# Patient Record
Sex: Female | Born: 1965
Health system: Southern US, Community
[De-identification: ages and names within clinical notes are randomized; demographics above are authoritative.]

## PROBLEM LIST (undated history)

## (undated) DIAGNOSIS — Z87898 Personal history of other specified conditions: Secondary | ICD-10-CM

## (undated) DIAGNOSIS — I10 Essential (primary) hypertension: Secondary | ICD-10-CM

## (undated) DIAGNOSIS — N882 Stricture and stenosis of cervix uteri: Secondary | ICD-10-CM

## (undated) DIAGNOSIS — J309 Allergic rhinitis, unspecified: Secondary | ICD-10-CM

## (undated) DIAGNOSIS — G43909 Migraine, unspecified, not intractable, without status migrainosus: Secondary | ICD-10-CM

## (undated) DIAGNOSIS — N950 Postmenopausal bleeding: Secondary | ICD-10-CM

## (undated) HISTORY — PX: TRANSPHENOIDAL PITUITARY RESECTION: SHX2572

## (undated) HISTORY — PX: DOBUTAMINE STRESS ECHO: SHX5426

---

## 1999-08-29 ENCOUNTER — Other Ambulatory Visit: Admission: RE | Admit: 1999-08-29 | Discharge: 1999-08-29 | Payer: Self-pay | Admitting: *Deleted

## 2002-07-19 ENCOUNTER — Other Ambulatory Visit: Admission: RE | Admit: 2002-07-19 | Discharge: 2002-07-19 | Payer: Self-pay | Admitting: *Deleted

## 2003-08-28 ENCOUNTER — Other Ambulatory Visit: Admission: RE | Admit: 2003-08-28 | Discharge: 2003-08-28 | Payer: Self-pay | Admitting: *Deleted

## 2004-09-12 ENCOUNTER — Other Ambulatory Visit: Admission: RE | Admit: 2004-09-12 | Discharge: 2004-09-12 | Payer: Self-pay | Admitting: Obstetrics and Gynecology

## 2005-11-19 ENCOUNTER — Other Ambulatory Visit: Admission: RE | Admit: 2005-11-19 | Discharge: 2005-11-19 | Payer: Self-pay | Admitting: Obstetrics and Gynecology

## 2008-05-12 ENCOUNTER — Ambulatory Visit: Payer: Self-pay | Admitting: Internal Medicine

## 2008-05-12 DIAGNOSIS — R0989 Other specified symptoms and signs involving the circulatory and respiratory systems: Secondary | ICD-10-CM

## 2008-05-12 DIAGNOSIS — R0609 Other forms of dyspnea: Secondary | ICD-10-CM | POA: Insufficient documentation

## 2008-05-12 DIAGNOSIS — R069 Unspecified abnormalities of breathing: Secondary | ICD-10-CM | POA: Insufficient documentation

## 2008-05-12 HISTORY — DX: Other forms of dyspnea: R06.09

## 2008-05-12 HISTORY — DX: Unspecified abnormalities of breathing: R06.9

## 2008-05-12 HISTORY — DX: Other specified symptoms and signs involving the circulatory and respiratory systems: R09.89

## 2008-05-16 ENCOUNTER — Telehealth (INDEPENDENT_AMBULATORY_CARE_PROVIDER_SITE_OTHER): Payer: Self-pay | Admitting: *Deleted

## 2008-05-16 DIAGNOSIS — E038 Other specified hypothyroidism: Secondary | ICD-10-CM

## 2008-05-16 DIAGNOSIS — E0590 Thyrotoxicosis, unspecified without thyrotoxic crisis or storm: Secondary | ICD-10-CM | POA: Insufficient documentation

## 2008-05-16 HISTORY — DX: Thyrotoxicosis, unspecified without thyrotoxic crisis or storm: E05.90

## 2008-05-16 HISTORY — DX: Other specified hypothyroidism: E03.8

## 2008-06-07 ENCOUNTER — Ambulatory Visit: Payer: Self-pay | Admitting: Endocrinology

## 2008-06-07 LAB — CONVERTED CEMR LAB
Free T4: 0.7 ng/dL (ref 0.6–1.6)
TSH: 0.07 microintl units/mL — ABNORMAL LOW (ref 0.35–5.50)

## 2008-06-26 ENCOUNTER — Encounter (HOSPITAL_COMMUNITY): Admission: RE | Admit: 2008-06-26 | Discharge: 2008-07-20 | Payer: Self-pay | Admitting: Endocrinology

## 2010-11-25 ENCOUNTER — Encounter: Payer: Self-pay | Admitting: Endocrinology

## 2010-12-01 LAB — CONVERTED CEMR LAB
ALT: 15 units/L (ref 0–35)
AST: 19 units/L (ref 0–37)
Albumin: 4 g/dL (ref 3.5–5.2)
Alkaline Phosphatase: 66 units/L (ref 39–117)
BUN: 8 mg/dL (ref 6–23)
Basophils Absolute: 0 10*3/uL (ref 0.0–0.1)
Basophils Relative: 0.4 % (ref 0.0–1.0)
Bilirubin, Direct: 0.1 mg/dL (ref 0.0–0.3)
CO2: 30 meq/L (ref 19–32)
Calcium: 9.3 mg/dL (ref 8.4–10.5)
Chloride: 106 meq/L (ref 96–112)
Creatinine, Ser: 0.7 mg/dL (ref 0.4–1.2)
Eosinophils Absolute: 0.1 10*3/uL (ref 0.0–0.7)
Eosinophils Relative: 1.3 % (ref 0.0–5.0)
GFR calc Af Amer: 117 mL/min
GFR calc non Af Amer: 97 mL/min
Glucose, Bld: 96 mg/dL (ref 70–99)
HCT: 40.7 % (ref 36.0–46.0)
Hemoglobin: 14 g/dL (ref 12.0–15.0)
Lymphocytes Relative: 35 % (ref 12.0–46.0)
MCHC: 34.4 g/dL (ref 30.0–36.0)
MCV: 92 fL (ref 78.0–100.0)
Monocytes Absolute: 0.3 10*3/uL (ref 0.1–1.0)
Monocytes Relative: 6.8 % (ref 3.0–12.0)
Neutro Abs: 2.7 10*3/uL (ref 1.4–7.7)
Neutrophils Relative %: 56.5 % (ref 43.0–77.0)
Platelets: 251 10*3/uL (ref 150–400)
Potassium: 4.8 meq/L (ref 3.5–5.1)
Pro B Natriuretic peptide (BNP): 9 pg/mL (ref 0.0–100.0)
Prolactin: 10.2 ng/mL
RBC: 4.42 M/uL (ref 3.87–5.11)
RDW: 12.4 % (ref 11.5–14.6)
Sodium: 140 meq/L (ref 135–145)
TSH: 0.16 microintl units/mL — ABNORMAL LOW (ref 0.35–5.50)
Total Bilirubin: 0.6 mg/dL (ref 0.3–1.2)
Total Protein: 6.9 g/dL (ref 6.0–8.3)
WBC: 4.8 10*3/uL (ref 4.5–10.5)

## 2010-12-03 NOTE — Progress Notes (Signed)
Summary: return call  Phone Note Call from Patient Call back at [phone]   Caller: Patient Call For: wert Summary of Call: returning call to leslie...please call her on cell phone [phone] Initial call taken by: Eugene Gavia,  May 16, 2008 9:11 AM  Follow-up for Phone Call        Done, see addendum from labs. Follow-up by: Vernie Murders,  May 16, 2008 2:39 PM  New Problems: HYPERTHYROIDISM (ICD-242.90)   New Problems: HYPERTHYROIDISM (ICD-242.90)

## 2010-12-03 NOTE — Assessment & Plan Note (Signed)
Summary: SOB/APC   Visit Type:  Initial Consult Referred by:  Dr. Richardean Chimera PCP:  Dr. Richardean Chimera  Chief Complaint:  SOB.  History of Present Illness: 45 yobf  never smoker with h/o mild seasonal rhinitis controlled with otc never sob with indolent onset winter 09  doe x carrying clothes across parking lot plus talking on phone voice no more raspy than usual (always this way) no noct symptoms stayed about the same since onset.  No cough, itching sneezing or wheezing.  Seems to correlate more with use of voice than exercise.   For example, she always gets sob at top of steps in her three story townhouse where she's lived for the last 4 years, no worse doe now than prior to onset of these most recent symptoms in Winter 09, unless she's talking on the phone when she goes up the steps,  Pt denies any significant sore throat, nasal congestion or excess secretions, fever, chills, sweats, unintended wt loss, pleuritic or exertional cp, orthopnea pnd or leg swelling.  Pt also denies any obvious fluctuation in symptoms with weather or environmental change or other alleviating or aggravating factors.    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   Family History:    no resp or heart dz    no thyroid problems  Social History:    Never smoker    Separated, has children    Works in Air[employer] pilot    Review of Systems  The patient denies anorexia, fever, weight loss, weight gain, vision loss, decreased hearing, hoarseness, chest pain, syncope, peripheral edema, prolonged cough, headaches, hemoptysis, abdominal pain, melena, hematochezia, severe indigestion/heartburn, hematuria, incontinence, muscle weakness, suspicious  skin lesions, transient blindness, difficulty walking, depression, unusual weight change, abnormal bleeding, enlarged lymph nodes, and angioedema.     Vital Signs:  Patient Profile:   45 Years Old Female Weight:      131.25 pounds O2 Sat:      100 % O2 treatment:    Room Air Temp:     98.2 degrees F oral Pulse rate:   72 / minute BP sitting:   124 / 74  (left arm)  Vitals Entered By: Vernie Murders (May 12, 2008 10:07 AM)                 Physical Exam    Ambulatory healthy appearing bf  in no acute distress with classic voice fatigue Afeb with normal vital signs HEENT: nl dentition, turbinates, and orophanx. Nl external ear canals without cough reflex, no lid lag Neck without JVD/Nodes/TM or thyroid bruit Lungs clear to A and P bilaterally without cough on insp or exp maneuvers RRR no s3 or murmur or increase in P2.  Resting pulse 72, at top of 3 flights 165 Abd soft and benign with nl excursion in the supine position. 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  will go ahead and initiate Lopressor at a dose of 25 mg b.i.d. and have her seen by our endocrinologist as soon as possible for a full workup.  Also will empirically treat her for occult acid reflux disease with diet and PPI short-term.    Medications Added to Medication List  This Visit: 1)  Nexium 40 Mg Cpdr (Esomeprazole magnesium) .... By mouth daily. take one half hour before eating.  Other Orders: Misc. Referral (Misc. Ref)   Patient Instructions: 1)  GERD (gastroesophageal reflux disease) was discussed. It is a common cause of respiratory symptoms. It commonly presents in the absence of heartburn. GERD can be treated with medication, but also with lifestyle changes including avoidance of late meals, excessive alcohol, smoking cessation, and avoid fatty foods, chocolate, peppermint, colas, red wine, and acidic juices such as orange juice.  2)  NO MINT OR MENTHOL PRODUCTS 3)  USE SUGARLESS CANDY INSTEAD (jolley ranchers)  4)  minimize coffee intake 5)  Please schedule a follow-up appointment in 2 weeks   ]

## 2010-12-03 NOTE — Assessment & Plan Note (Signed)
Summary: NEW ENDO CONSULT/DR WERT/HYPERTHYROID/[employer] $50 /[place]   Vital Signs:  Patient Profile:   45 Years Old Female Weight:      133.0 pounds Temp:     97.2 degrees F oral Pulse rate:   70 / minute BP sitting:   122 / 78  (left arm) Cuff size:   regular  Pt. in pain?   no  Vitals Entered By: Orlan Leavens (June 07, 2008 9:58 AM)                  Referred by:  Dr. Richardean Chimera PCP:  Dr. Richardean Chimera  Chief Complaint:  New Endo/ Hyperthyroidism Also pt concern about the metoprolol Md started her on since started been sleeping alot.  History of Present Illness: pt was recently seen for sob, and was noted to have doe, and assoc palpitations in the chest.  also has slight anxiety.  was rx'ed lopressor.  since on it, palpitations are better, she has severe fatigue    Current Allergies: No known allergies   Past Medical History:    Reviewed history from 05/12/2008 and no changes required:       HYPERTHYROIDISM (ICD-242.90)       DYSPNEA (ICD-786.09)          Family History:    Reviewed history from 05/12/2008 and no changes required:       no resp or heart dz       no thyroid problems  Social History:    Reviewed history from 05/12/2008 and no changes required:       Never smoker       Separated, has children       Works in Airline pilot    Review of Systems  The patient denies syncope.         denies weight loss, eye pain, headache, cough, diarrhea, myalgias, excessive diaphoresis, tremor.  pt has slight rhinorrhea and easy bruising   Physical Exam  General:     well developed, well nourished, in no acute distress Head:     head is normocephalic eyes: no scleral icterus no periorbital swelling perrl external ears are normal nose normal externally mouth has no lesion, including normal tongue  Eyes:     no proptosis Neck:     i do not appreciate a goiter Lungs:     clear to auscultation.  no respiratory distress  Heart:     regular rate and rhythm, S1,  S2 without murmurs, rubs, gallops, or clicks Msk:     no deformity or scoliosis noted with normal posture and gait Neurologic:     cn 2-12 grossly intact.   readily moves all 4's.   no tremor Skin:     normal texture and temp.  no rash.  not diaphoretic  Cervical Nodes:     no significant adenopathy Psych:     alert and cooperative; normal mood and affect; normal attention span and concentration Additional Exam:      FREE T4                   0.7 ng/dL                   3.2-4.4 FastTSH              [L]  0.07 uIU/mL       Impression & Recommendations:  Problem # 1:  HYPERTHYROIDISM (ICD-242.90)  Problem # 2:  fatigue could be due to #1, or  to lopressor  Problem # 3:  palpitations due to #1, improved on lopressor  Medications Added to Medication List This Visit: 1)  Metoprolol Tartrate 25 Mg Tabs (Metoprolol tartrate) .... 1/2 bid   Patient Instructions: 1)  cc dr Arelia Sneddon 2)  scan 3)  reduce lopressor to 12.5 bid   Prescriptions: METOPROLOL TARTRATE 25 MG  TABS (METOPROLOL TARTRATE) 1/2 bid  #30 x 2   Entered and Authorized by:   Minus Breeding MD   Signed by:   Minus Breeding MD on 06/07/2008   Method used:   Electronically sent to ...       [facility] [phone]*       [address]       [place], [place]  [address]       Ph: [phone]       Fax: [phone]   RxID:   [id number]  ]

## 2011-09-09 ENCOUNTER — Other Ambulatory Visit: Payer: Self-pay | Admitting: Family Medicine

## 2011-09-09 DIAGNOSIS — Z1231 Encounter for screening mammogram for malignant neoplasm of breast: Secondary | ICD-10-CM

## 2011-10-01 ENCOUNTER — Ambulatory Visit
Admission: RE | Admit: 2011-10-01 | Discharge: 2011-10-01 | Disposition: A | Discharge status: Home / Self Care | Payer: BC Managed Care – PPO | Source: Ambulatory Visit | Attending: Family Medicine | Admitting: Family Medicine

## 2011-10-01 DIAGNOSIS — Z1231 Encounter for screening mammogram for malignant neoplasm of breast: Secondary | ICD-10-CM

## 2012-09-22 ENCOUNTER — Other Ambulatory Visit: Payer: Self-pay | Admitting: Family Medicine

## 2012-09-22 DIAGNOSIS — Z1231 Encounter for screening mammogram for malignant neoplasm of breast: Secondary | ICD-10-CM

## 2012-10-18 ENCOUNTER — Ambulatory Visit
Admission: RE | Admit: 2012-10-18 | Discharge: 2012-10-18 | Disposition: A | Discharge status: Home / Self Care | Payer: BC Managed Care – PPO | Source: Ambulatory Visit | Attending: Family Medicine | Admitting: Family Medicine

## 2012-10-18 DIAGNOSIS — Z1231 Encounter for screening mammogram for malignant neoplasm of breast: Secondary | ICD-10-CM

## 2013-10-19 ENCOUNTER — Other Ambulatory Visit: Payer: Self-pay

## 2013-10-19 DIAGNOSIS — Z1231 Encounter for screening mammogram for malignant neoplasm of breast: Secondary | ICD-10-CM

## 2013-10-31 ENCOUNTER — Ambulatory Visit
Admission: RE | Admit: 2013-10-31 | Discharge: 2013-10-31 | Disposition: A | Discharge status: Home / Self Care | Payer: BC Managed Care – PPO | Source: Ambulatory Visit

## 2013-10-31 DIAGNOSIS — Z1231 Encounter for screening mammogram for malignant neoplasm of breast: Secondary | ICD-10-CM

## 2014-09-07 DIAGNOSIS — D352 Benign neoplasm of pituitary gland: Secondary | ICD-10-CM

## 2014-09-07 DIAGNOSIS — G4489 Other headache syndrome: Secondary | ICD-10-CM

## 2014-09-07 HISTORY — DX: Benign neoplasm of pituitary gland: D35.2

## 2014-09-07 HISTORY — DX: Other headache syndrome: G44.89

## 2014-09-20 DIAGNOSIS — K581 Irritable bowel syndrome with constipation: Secondary | ICD-10-CM

## 2014-09-20 DIAGNOSIS — K589 Irritable bowel syndrome without diarrhea: Secondary | ICD-10-CM | POA: Insufficient documentation

## 2014-09-20 DIAGNOSIS — R87619 Unspecified abnormal cytological findings in specimens from cervix uteri: Secondary | ICD-10-CM | POA: Insufficient documentation

## 2014-09-20 DIAGNOSIS — IMO0002 Reserved for concepts with insufficient information to code with codable children: Secondary | ICD-10-CM | POA: Insufficient documentation

## 2014-09-20 DIAGNOSIS — N951 Menopausal and female climacteric states: Secondary | ICD-10-CM | POA: Insufficient documentation

## 2014-09-20 DIAGNOSIS — M79676 Pain in unspecified toe(s): Secondary | ICD-10-CM

## 2014-09-20 DIAGNOSIS — F4320 Adjustment disorder, unspecified: Secondary | ICD-10-CM | POA: Insufficient documentation

## 2014-09-20 DIAGNOSIS — S92532A Displaced fracture of distal phalanx of left lesser toe(s), initial encounter for closed fracture: Secondary | ICD-10-CM

## 2014-09-20 DIAGNOSIS — F419 Anxiety disorder, unspecified: Secondary | ICD-10-CM | POA: Insufficient documentation

## 2014-09-20 HISTORY — DX: Anxiety disorder, unspecified: F41.9

## 2014-09-20 HISTORY — DX: Adjustment disorder, unspecified: F43.20

## 2014-09-20 HISTORY — DX: Displaced fracture of distal phalanx of left lesser toe(s), initial encounter for closed fracture: S92.532A

## 2014-09-20 HISTORY — DX: Irritable bowel syndrome with constipation: K58.1

## 2014-09-20 HISTORY — DX: Pain in unspecified toe(s): M79.676

## 2014-09-20 HISTORY — DX: Reserved for concepts with insufficient information to code with codable children: IMO0002

## 2014-09-20 HISTORY — DX: Menopausal and female climacteric states: N95.1

## 2014-09-20 HISTORY — DX: Irritable bowel syndrome without diarrhea: K58.9

## 2014-10-11 ENCOUNTER — Other Ambulatory Visit: Payer: Self-pay

## 2014-10-11 DIAGNOSIS — Z1231 Encounter for screening mammogram for malignant neoplasm of breast: Secondary | ICD-10-CM

## 2014-11-01 ENCOUNTER — Encounter (INDEPENDENT_AMBULATORY_CARE_PROVIDER_SITE_OTHER): Payer: Self-pay

## 2014-11-01 ENCOUNTER — Ambulatory Visit
Admission: RE | Admit: 2014-11-01 | Discharge: 2014-11-01 | Disposition: A | Discharge status: Home / Self Care | Payer: BC Managed Care – PPO | Source: Ambulatory Visit

## 2014-11-01 DIAGNOSIS — Z1231 Encounter for screening mammogram for malignant neoplasm of breast: Secondary | ICD-10-CM

## 2015-10-24 ENCOUNTER — Other Ambulatory Visit: Payer: Self-pay

## 2015-10-24 DIAGNOSIS — Z1231 Encounter for screening mammogram for malignant neoplasm of breast: Secondary | ICD-10-CM

## 2015-11-21 ENCOUNTER — Ambulatory Visit
Admission: RE | Admit: 2015-11-21 | Discharge: 2015-11-21 | Disposition: A | Discharge status: Home / Self Care | Payer: BLUE CROSS/BLUE SHIELD | Source: Ambulatory Visit

## 2015-11-21 DIAGNOSIS — Z1231 Encounter for screening mammogram for malignant neoplasm of breast: Secondary | ICD-10-CM

## 2016-11-03 HISTORY — PX: COLONOSCOPY WITH PROPOFOL: SHX5780

## 2016-12-25 ENCOUNTER — Other Ambulatory Visit: Payer: Self-pay | Admitting: Family Medicine

## 2016-12-25 DIAGNOSIS — Z1231 Encounter for screening mammogram for malignant neoplasm of breast: Secondary | ICD-10-CM

## 2016-12-30 DIAGNOSIS — J4520 Mild intermittent asthma, uncomplicated: Secondary | ICD-10-CM | POA: Insufficient documentation

## 2016-12-30 DIAGNOSIS — J4521 Mild intermittent asthma with (acute) exacerbation: Secondary | ICD-10-CM | POA: Insufficient documentation

## 2016-12-30 DIAGNOSIS — J208 Acute bronchitis due to other specified organisms: Secondary | ICD-10-CM

## 2016-12-30 HISTORY — DX: Mild intermittent asthma with (acute) exacerbation: J45.21

## 2016-12-30 HISTORY — DX: Acute bronchitis due to other specified organisms: J20.8

## 2017-01-14 ENCOUNTER — Ambulatory Visit
Admission: RE | Admit: 2017-01-14 | Discharge: 2017-01-14 | Disposition: A | Discharge status: Home / Self Care | Payer: Managed Care, Other (non HMO) | Source: Ambulatory Visit | Attending: Family Medicine | Admitting: Family Medicine

## 2017-01-14 DIAGNOSIS — Z1231 Encounter for screening mammogram for malignant neoplasm of breast: Secondary | ICD-10-CM

## 2017-03-23 DIAGNOSIS — E663 Overweight: Secondary | ICD-10-CM | POA: Insufficient documentation

## 2017-03-23 DIAGNOSIS — I10 Essential (primary) hypertension: Secondary | ICD-10-CM | POA: Insufficient documentation

## 2017-03-23 DIAGNOSIS — G2579 Other drug induced movement disorders: Secondary | ICD-10-CM

## 2017-03-23 DIAGNOSIS — G9081 Serotonin syndrome: Secondary | ICD-10-CM

## 2017-03-23 HISTORY — DX: Essential (primary) hypertension: I10

## 2017-03-23 HISTORY — DX: Other drug induced movement disorders: G25.79

## 2017-03-23 HISTORY — DX: Overweight: E66.3

## 2017-03-23 HISTORY — DX: Serotonin syndrome: G90.81

## 2017-04-22 DIAGNOSIS — Z1211 Encounter for screening for malignant neoplasm of colon: Secondary | ICD-10-CM | POA: Insufficient documentation

## 2017-04-22 HISTORY — DX: Encounter for screening for malignant neoplasm of colon: Z12.11

## 2017-05-14 LAB — HM COLONOSCOPY

## 2017-09-15 DIAGNOSIS — R600 Localized edema: Secondary | ICD-10-CM | POA: Insufficient documentation

## 2017-09-15 HISTORY — DX: Localized edema: R60.0

## 2017-09-30 DIAGNOSIS — T783XXA Angioneurotic edema, initial encounter: Secondary | ICD-10-CM

## 2017-09-30 HISTORY — DX: Angioneurotic edema, initial encounter: T78.3XXA

## 2017-10-12 ENCOUNTER — Encounter (HOSPITAL_BASED_OUTPATIENT_CLINIC_OR_DEPARTMENT_OTHER): Payer: Self-pay | Admitting: Emergency Medicine

## 2017-10-12 ENCOUNTER — Emergency Department (HOSPITAL_BASED_OUTPATIENT_CLINIC_OR_DEPARTMENT_OTHER): Payer: Managed Care, Other (non HMO)

## 2017-10-12 ENCOUNTER — Other Ambulatory Visit: Payer: Self-pay

## 2017-10-12 ENCOUNTER — Emergency Department (HOSPITAL_BASED_OUTPATIENT_CLINIC_OR_DEPARTMENT_OTHER)
Admission: EM | Admit: 2017-10-12 | Discharge: 2017-10-12 | Disposition: A | Discharge status: Home / Self Care | Payer: Managed Care, Other (non HMO) | Attending: Emergency Medicine | Admitting: Emergency Medicine

## 2017-10-12 DIAGNOSIS — R1013 Epigastric pain: Secondary | ICD-10-CM | POA: Diagnosis not present

## 2017-10-12 DIAGNOSIS — R0602 Shortness of breath: Secondary | ICD-10-CM | POA: Diagnosis not present

## 2017-10-12 DIAGNOSIS — Z79899 Other long term (current) drug therapy: Secondary | ICD-10-CM | POA: Diagnosis not present

## 2017-10-12 DIAGNOSIS — R5381 Other malaise: Secondary | ICD-10-CM | POA: Insufficient documentation

## 2017-10-12 DIAGNOSIS — I10 Essential (primary) hypertension: Secondary | ICD-10-CM | POA: Diagnosis not present

## 2017-10-12 DIAGNOSIS — R1010 Upper abdominal pain, unspecified: Secondary | ICD-10-CM | POA: Diagnosis not present

## 2017-10-12 DIAGNOSIS — R51 Headache: Secondary | ICD-10-CM | POA: Diagnosis present

## 2017-10-12 HISTORY — DX: Essential (primary) hypertension: I10

## 2017-10-12 LAB — TROPONIN I: Troponin I: <0.03 ng/mL (ref <0.03)

## 2017-10-12 MED ORDER — DIPHENHYDRAMINE HCL 50 MG/ML IJ SOLN: 25.0000 mg | Freq: Once | INTRAMUSCULAR | Status: DC

## 2017-10-12 MED ORDER — DEXAMETHASONE SODIUM PHOSPHATE 10 MG/ML IJ SOLN: 10.0000 mg | Freq: Once | INTRAMUSCULAR | Status: DC

## 2017-10-12 MED ORDER — METOCLOPRAMIDE HCL 5 MG/ML IJ SOLN: 10.0000 mg | Freq: Once | INTRAMUSCULAR | Status: DC

## 2017-10-12 NOTE — Discharge Instructions (Signed)
Try doing 1 cap full of MiraLAX in 8 ounces of water daily for the next week and continue the Zantac for the next 2 weeks.  If your stomach discomfort does not improve at that time you will need to follow-up with your regular doctor or your gastroenterologist who did your colonoscopy for further evaluation.  You would need to seek evaluation sooner if you develop severe pain, fever, vomiting or excessive diarrhea.

## 2017-10-12 NOTE — ED Triage Notes (Addendum)
Patient reports recently diagnosed with HTN.  Reports allergic reaction to 2 medications. States changed medications last week and states that BP remains unchanged after taking medication.  Reports headache and shortness of breath. Denies CP at present

## 2017-10-12 NOTE — ED Notes (Signed)
ED Provider at bedside discussing test results and dispo plan of care. 

## 2017-10-12 NOTE — ED Notes (Signed)
Patient transported to X-ray 

## 2017-10-12 NOTE — ED Provider Notes (Signed)
[facility] DEPART[facility] Provider Note   CSN: [id number] Arrival date & time: 10/12/17  1537     History   Chief Complaint Chief Complaint  Patient presents with  . Hypertension  . Headache    HPI Gloria Bass is a 51 y.o. female.  Patient is a 51 year old female with a history of hypertension presenting today with headache, upper abdominal pain, general malaise and some slight shortness of breath.  Patient states that since May she was diagnosed with hypertension and she has gone through 6 different blood pressure medications due to intolerance and allergy.  Patient was most recently switched to clonidine and terazosin 2 weeks ago and has been taking those but she is concerned they are not working.  She was asymptomatic last week when she started taking the medication however on Saturday 3 days prior to arrival she started not feeling well.  She states she had a headache, soreness of her eyes, upper abdominal fullness, pain and some slight shortness of breath.  She then decided to check her blood pressure and at that time it was 160/109.  She checked her blood pressure multiple other times and it remained in that range so she spoke with her doctor who increased the clonidine 0.2 mg twice a day which the patient has been doing for the last 2 days but her symptoms have not improved.  She states since Saturday she has had persistent epigastric discomfort, bloating which is worse with eating and this headache.  She denies fever, cough or congestion.  She states she feels slightly winded but no leg swelling or pain.  She has no prior history of heart disease or blood clots in herself or her family members.  She has not had any chest pain.  Symptoms are not worse with lying flat.  Did start taking Zantac yesterday without any change in her symptoms.  Patient states she has had symptoms with her abdomen like this intermittently for the last few weeks to months but had never been  constant until Saturday.   The history is provided by the patient.  Headache      Past Medical History:  Diagnosis Date  . Hypertension     Patient Active Problem List   Diagnosis Date Noted  . HYPERTHYROIDISM 05/16/2008  . DYSPNEA 05/12/2008    History reviewed. No pertinent surgical history.  OB History    No data available       Home Medications    Prior to Admission medications   Medication Sig Start Date End Date Taking? Authorizing Provider  Cholecalciferol (VITAMIN D PO) Take by mouth.   Yes [provider]  cloNIDine (CATAPRES) 0.1 MG tablet Take 0.2 mg by mouth 2 (two) times daily.    Yes [provider]  terazosin (HYTRIN) 1 MG capsule Take 1 mg by mouth at bedtime.   Yes [provider]    Family History History reviewed. No pertinent family history.  Social History Social History   Tobacco Use  . Smoking status: Never Smoker  . Smokeless tobacco: Never Used  Substance Use Topics  . Alcohol use: Yes  . Drug use: No     Allergies   Hydrochlorothiazide and Lisinopril   Review of Systems Review of Systems  All other systems reviewed and are negative.    Physical Exam Updated Vital Signs BP (!) 142/93 (BP Location: Right Arm)   Pulse (!) 50   Temp 98 F (36.7 C) (Oral)   Resp 16  Ht 5\' 2"  (1.575 m)   Wt 65.8 kg (145 lb)   SpO2 100%   BMI 26.52 kg/m   Physical Exam  Constitutional: She is oriented to person, place, and time. She appears well-developed and well-nourished. No distress.  HENT:  Head: Normocephalic and atraumatic.  Mouth/Throat: Oropharynx is clear and moist.  Eyes: Conjunctivae and EOM are normal. Pupils are equal, round, and reactive to light.  Neck: Normal range of motion. Neck supple.  Cardiovascular: Normal rate, regular rhythm and intact distal pulses.  No murmur heard. Pulmonary/Chest: Effort normal and breath sounds normal. No respiratory distress. She has no wheezes. She has no  rales.  Abdominal: Soft. She exhibits distension. There is tenderness in the epigastric area. There is no rebound and no guarding.    Musculoskeletal: Normal range of motion. She exhibits no edema or tenderness.  Neurological: She is alert and oriented to person, place, and time.  Skin: Skin is warm and dry. No rash noted. No erythema.  Psychiatric: She has a normal mood and affect. Her behavior is normal.  Nursing note and vitals reviewed.    ED Treatments / Results  Labs (all labs ordered are listed, but only abnormal results are displayed) Labs Reviewed  TROPONIN I    EKG  EKG Interpretation  Date/Time:  Monday October 12 2017 17:05:06 EST Ventricular Rate:  54 PR Interval:    QRS Duration: 96 QT Interval:  441 QTC Calculation: 418 R Axis:   49 Text Interpretation:  Sinus rhythm Normal ECG No previous tracing Confirmed by Blanchie Dessert [phone]) on 10/12/2017 5:13:09 PM       Radiology Dg Abd Acute W/chest  Result Date: 10/12/2017 CLINICAL DATA:  Patient with shortness of breath with exertion. Headache. EXAM: DG ABDOMEN ACUTE W/ 1V CHEST COMPARISON:  Chest radiograph 05/12/2008 FINDINGS: Monitoring leads overlie the patient. Normal cardiac and mediastinal contours. Contour density along the superior right mediastinum likely vascular in etiology. No consolidative pulmonary opacities. No pleural effusion or pneumothorax. Paucity of small bowel gas. Stool throughout the colon. No evidence for free intraperitoneal air. Unremarkable osseous skeleton. IMPRESSION: Stool throughout the colon as can be seen with constipation. No acute cardiopulmonary process. Electronically Signed   By: Lovey Newcomer M.D.   On: 10/12/2017 17:33    Procedures Procedures (including critical care time)  Medications Ordered in ED Medications - No data to display   Initial Impression / Assessment and Plan / ED Course  I have reviewed the triage vital signs and the nursing notes.  Pertinent  labs & imaging results that were available during my care of the patient were reviewed by me and considered in my medical decision making (see chart for details).     Patient with a history of hypertension who has been having intermittent abnormal blood pressure readings despite starting clonidine and terazosin presenting with general malaise, upper abdominal discomfort and headache.  Feel less likely that patient's blood pressure is the source of her symptoms.  Concerned with the epigastric discomfort, bloating in it causing her to feel mildly short of breath.  Low risk Wells criteria and no symptoms to to suggest PE.  Patient's heart rate is in the 50s she has no leg pain or swelling no recent travel and no hormone therapy.  There is no family history of clots.  Patient's EKG is within normal limits and troponin is negative with low suspicion that this is cardiac in nature.  Patient had blood work done within the last few weeks  that showed a normal renal function, normal LFTs and normal hemoglobin.  Low suspicion that patient has pancreatitis, hepatitis, cholecystitis based on her exam.  Concern for possible hiatal hernia versus stomach ulcer versus IBD.  Lower suspicion for abdominal cancer.  Patient's acute abdominal series show a normal chest x-ray with normal cardiac size and no evidence of pulmonary edema.  There is stool throughout the colon that could be related to constipation.  However will discuss with patient whether she wants to try a laxative to see if symptoms improve versus doing a CAT scan today for further evaluation.  6:00 PM Talk with the patient about CAT scan versus MiraLAX and follow-up if symptoms do not improve and patient chose to continue Zantac for now and use the MiraLAX.  Repeat blood pressure was 146/84.  Discussed with patient it would not be a good idea to change her blood pressure medication at this point due being able to tolerate multiple other medications.  However she  states the clonidine does make her very sleepy.  Encouraged her to call her doctor tomorrow for possible change in her medication yet again.  Also discussed with her different food she can avoid to avoid salt.  Patient also stated she had a normal colonoscopy 2 months ago.  She will follow-up with GI for symptoms do not improve.    Final Clinical Impressions(s) / ED Diagnoses   Final diagnoses:  Epigastric abdominal pain of unknown etiology  Essential hypertension    ED Discharge Orders    None       Blanchie Dessert, MD 10/12/17 [phone]

## 2017-11-09 ENCOUNTER — Ambulatory Visit (INDEPENDENT_AMBULATORY_CARE_PROVIDER_SITE_OTHER): Payer: Managed Care, Other (non HMO) | Admitting: Cardiology

## 2017-11-09 ENCOUNTER — Encounter: Payer: Self-pay | Admitting: Cardiology

## 2017-11-09 VITALS — BP 120/76 | HR 71 | Ht 62.0 in | Wt 145.0 lb

## 2017-11-09 DIAGNOSIS — R079 Chest pain, unspecified: Secondary | ICD-10-CM | POA: Insufficient documentation

## 2017-11-09 DIAGNOSIS — I10 Essential (primary) hypertension: Secondary | ICD-10-CM | POA: Diagnosis not present

## 2017-11-09 HISTORY — DX: Chest pain, unspecified: R07.9

## 2017-11-09 MED ORDER — METOPROLOL SUCCINATE ER 50 MG PO TB24: 50.0000 mg | ORAL_TABLET | Freq: Every day | ORAL | 0 refills | Status: DC

## 2017-11-09 NOTE — Progress Notes (Signed)
Cardiology Office Note:    Date:  11/09/2017   ID:  Gloria Bass, DOB 1966-02-12, MRN [id number]  PCP:  Gloria Peppers, MD  Cardiologist:  Gloria Lindau, MD   Referring MD: Gloria Peppers, MD    ASSESSMENT:    1. Essential hypertension   2. Chest pain, unspecified type    PLAN:    In order of problems listed above:  1. I discussed my findings with the patient at extensive length.  Diet was discussed for essential hypertension.  Salt intake issues were discussed in view of her current challenges have asked her to stop her ethacrynic acid.  She will cut down clonidine at 2.1 mg daily and she will stop that medicine in a week.  Have initiated an hour on Toprol-XL 50 mg daily.  In order to assess her chest pain symptoms I will do an exercise stress echo.  She will be back in follow-up appointment in 2 weeks or earlier if she has any concerns.  Lifestyle modification was also discussed.  I told her that she is overwhelmed and worried about her high blood pressure issues which are not that terribly serious or bad at this time that we will do our best to optimize her blood pressure.  I told her that worrying about this constantly will also be a challenge in managing her blood pressure and she vocalized understanding.   Medication Adjustments/Labs and Tests Ordered: Current medicines are reviewed at length with the patient today.  Concerns regarding medicines are outlined above.  Orders Placed This Encounter  Procedures  . ECHOCARDIOGRAM STRESS TEST   Meds ordered this encounter  Medications  . metoprolol succinate (TOPROL-XL) 50 MG 24 hr tablet    Sig: Take 1 tablet (50 mg total) by mouth daily. Take with or immediately following a meal.    Dispense:  90 tablet    Refill:  0     History of Present Illness:    Gloria Bass is a 52 y.o. female who is being seen today for the evaluation of essential hypertension at the request of Gloria Peppers, MD.  Patient is a pleasant 52 year old  female.  She has past medical history of essential hypertension.  She mentions to me that she takes care of activities of daily living and recently is living a sedentary lifestyle.  She denies some chest tightness at times not related to exertion.  No orthopnea or PND.  No history of diabetes mellitus or smoking.  Her chest tightness happens very occasionally and is been on and off for the past several months.  There is no history of radiation to any part of the body.  At the time of my evaluation, the patient is alert awake oriented and in no distress.  Past Medical History:  Diagnosis Date  . Hypertension     History reviewed. No pertinent surgical history.  Current Medications: Current Meds  Medication Sig  . Cholecalciferol (VITAMIN D PO) Take by mouth.  . cloNIDine (CATAPRES) 0.1 MG tablet Take 0.1 mg by mouth 2 (two) times daily.  Marland Kitchen ethacrynic acid (EDECRIN) 25 MG tablet TK 1 TO 2 TS PO PRF SWELLING  . SUMAtriptan (IMITREX) 100 MG tablet Take 50-100 mg by mouth.  . Vitamin D, Ergocalciferol, (DRISDOL) 50000 units CAPS capsule TK 1 C PO 1 TIME A WK  . [DISCONTINUED] amLODipine (NORVASC) 5 MG tablet Take 5 mg by mouth.  . [DISCONTINUED] Hydrocodone-Homatropine 5-1.5 MG TABS Take by mouth.  . [DISCONTINUED]  lisinopril (PRINIVIL,ZESTRIL) 5 MG tablet Take 5 mg by mouth.  . [DISCONTINUED] phentermine (ADIPEX-P) 37.5 MG tablet Take 37.5 mg by mouth.  . [DISCONTINUED] terazosin (HYTRIN) 2 MG capsule TK 1 C PO IN THE MORNING  . [DISCONTINUED] triamterene-hydrochlorothiazide (MAXZIDE-25) 37.5-25 MG tablet Take by mouth.     Allergies:   Lisinopril and Maxzide [hydrochlorothiazide w-triamterene]   Social History   Socioeconomic History  . Marital status: Married    Spouse name: None  . Number of children: None  . Years of education: None  . Highest education level: None  Social Needs  . Financial resource strain: None  . Food insecurity - worry: None  . Food insecurity - inability:  None  . Transportation needs - medical: None  . Transportation needs - non-medical: None  Occupational History  . None  Tobacco Use  . Smoking status: Never Smoker  . Smokeless tobacco: Never Used  Substance and Sexual Activity  . Alcohol use: Yes  . Drug use: No  . Sexual activity: None  Other Topics Concern  . None  Social History Narrative  . None     Family History: The patient's family history is not on file.  ROS:   Please see the history of present illness.    All other systems reviewed and are negative.  EKGs/Labs/Other Studies Reviewed:    The following studies were reviewed today: I reviewed patient's office visit records extensively and EKG also.  Lab work was reviewed 2.   Recent Labs: No results found for requested labs within last 8760 hours.  Recent Lipid Panel No results found for: CHOL, TRIG, HDL, CHOLHDL, VLDL, LDLCALC, LDLDIRECT  Physical Exam:    VS:  BP 120/76 (BP Location: Right Arm, Patient Position: Sitting, Cuff Size: Normal)   Pulse 71   Ht 5\' 2"  (1.575 m)   Wt 145 lb (65.8 kg)   SpO2 98%   BMI 26.52 kg/m     Wt Readings from Last 3 Encounters:  11/09/17 145 lb (65.8 kg)  10/12/17 145 lb (65.8 kg)     GEN: Patient is in no acute distress HEENT: Normal NECK: No JVD; No carotid bruits LYMPHATICS: No lymphadenopathy CARDIAC: S1 S2 regular, 2/6 systolic murmur at the apex. RESPIRATORY:  Clear to auscultation without rales, wheezing or rhonchi  ABDOMEN: Soft, non-tender, non-distended MUSCULOSKELETAL:  No edema; No deformity  SKIN: Warm and dry NEUROLOGIC:  Alert and oriented x 3 PSYCHIATRIC:  Normal affect    Signed, Gloria Lindau, MD  11/09/2017 11:59 AM    [place]

## 2017-11-09 NOTE — Patient Instructions (Addendum)
Medication Instructions:  Your physician has recommended you make the following change in your medication:  STOP terazosin START metoprolol 50 every day CHANGE clonidine 0.1 mg to daily for 1 week then discontinue  Labwork: None  Testing/Procedures: Your physician has requested that you have a stress echocardiogram. For further information please visit [email or web address]. Please follow instruction sheet as given.   Follow-Up: Your physician recommends that you schedule a follow-up appointment in: 2 weeks   Any Other Special Instructions Will Be Listed Below (If Applicable).  Please log your blood pressure twice a day an hour after taking your medications. Bring this log to your follow-up appointment.   If you need a refill on your cardiac medications before your next appointment, please call your pharmacy.   [place], RN, BSN   Metoprolol extended-release tablets What is this medicine? METOPROLOL (me TOE proe lole) is a beta-blocker. Beta-blockers reduce the workload on the heart and help it to beat more regularly. This medicine is used to treat high blood pressure and to prevent chest pain. It is also used to after a heart attack and to prevent an additional heart attack from occurring. This medicine may be used for other purposes; ask your health care provider or pharmacist if you have questions. COMMON BRAND NAME(S): toprol, Toprol XL What should I tell my health care provider before I take this medicine? They need to know if you have any of these conditions: -diabetes -heart or vessel disease like slow heart rate, worsening heart failure, heart block, sick sinus syndrome or Raynaud's disease -kidney disease -liver disease -lung or breathing disease, like asthma or emphysema -pheochromocytoma -thyroid disease -an unusual or allergic reaction to metoprolol, other beta-blockers, medicines, foods, dyes, or preservatives -pregnant or trying to get  pregnant -breast-feeding How should I use this medicine? Take this medicine by mouth with a glass of water. Follow the directions on the prescription label. Do not crush or chew. Take this medicine with or immediately after meals. Take your doses at regular intervals. Do not take more medicine than directed. Do not stop taking this medicine suddenly. This could lead to serious heart-related effects. Talk to your pediatrician regarding the use of this medicine in children. While this drug may be prescribed for children as young as 6 years for selected conditions, precautions do apply. Overdosage: If you think you have taken too much of this medicine contact a poison control center or emergency room at once. NOTE: This medicine is only for you. Do not share this medicine with others. What if I miss a dose? If you miss a dose, take it as soon as you can. If it is almost time for your next dose, take only that dose. Do not take double or extra doses. What may interact with this medicine? This medicine may interact with the following medications: -certain medicines for blood pressure, heart disease, irregular heart beat -certain medicines for depression, like monoamine oxidase (MAO) inhibitors, fluoxetine, or paroxetine -clonidine -dobutamine -epinephrine -isoproterenol -reserpine This list may not describe all possible interactions. Give your health care provider a list of all the medicines, herbs, non-prescription drugs, or dietary supplements you use. Also tell them if you smoke, drink alcohol, or use illegal drugs. Some items may interact with your medicine. What should I watch for while using this medicine? Visit your doctor or health care professional for regular check ups. Contact your doctor right away if your symptoms worsen. Check your blood pressure and pulse rate regularly.  Ask your health care professional what your blood pressure and pulse rate should be, and when you should contact  them. You may get drowsy or dizzy. Do not drive, use machinery, or do anything that needs mental alertness until you know how this medicine affects you. Do not sit or stand up quickly, especially if you are an older patient. This reduces the risk of dizzy or fainting spells. Contact your doctor if these symptoms continue. Alcohol may interfere with the effect of this medicine. Avoid alcoholic drinks. What side effects may I notice from receiving this medicine? Side effects that you should report to your doctor or health care professional as soon as possible: -allergic reactions like skin rash, itching or hives -cold or numb hands or feet -depression -difficulty breathing -faint -fever with sore throat -irregular heartbeat, chest pain -rapid weight gain -swollen legs or ankles Side effects that usually do not require medical attention (report to your doctor or health care professional if they continue or are bothersome): -anxiety or nervousness -change in sex drive or performance -dry skin -headache -nightmares or trouble sleeping -short term memory loss -stomach upset or diarrhea -unusually tired This list may not describe all possible side effects. Call your doctor for medical advice about side effects. You may report side effects to FDA at 1-800-FDA-1088. Where should I keep my medicine? Keep out of the reach of children. Store at room temperature between 15 and 30 degrees C (59 and 86 degrees F). Throw away any unused medicine after the expiration date. NOTE: This sheet is a summary. It may not cover all possible information. If you have questions about this medicine, talk to your doctor, pharmacist, or health care provider.  2018 Elsevier/Gold Standard (2013-06-24 14:41:37)  Exercise Stress Echocardiogram An exercise stress echocardiogram is a test to check how well your heart is working. This test uses sound waves (ultrasound) and a computer to make images of your heart before and  after exercise. Ultrasound images that are taken before you exercise (your resting echocardiogram) will show how much blood is getting to your heart muscle and how well your heart muscle and heart valves are functioning. During the next part of this test, you will walk on a treadmill or ride a stationary bike to see how exercise affects your heart. While you exercise, the electrical activity of your heart will be monitored with an electrocardiogram (ECG). Your blood pressure will also be monitored. You may have this test if you:  Have chest pain or other symptoms of a heart problem.  Recently had a heart attack or heart surgery.  Have heart valve problems.  Have a condition that causes narrowing of the blood vessels that supply your heart (coronary artery disease).  Have a high risk of heart disease and are starting a new exercise program.  Have a high risk of heart disease and need to have major surgery.  Tell a health care provider about:  Any allergies you have.  All medicines you are taking, including vitamins, herbs, eye drops, creams, and over-the-counter medicines.  Any problems you or family members have had with anesthetic medicines.  Any blood disorders you have.  Any surgeries you have had.  Any medical conditions you have.  Whether you are pregnant or may be pregnant. What are the risks? Generally, this is a safe procedure. However, problems may occur, including:  Chest pain.  Dizziness or light-headedness.  Shortness of breath.  Increased or irregular heartbeat (palpitations).  Nausea or vomiting.  Heart  attack (very rare).  What happens before the procedure?  Follow instructions from your health care provider about eating or drinking restrictions. You may be asked to avoid all forms of caffeine for 24 hours before your procedure, or as told by your health care provider.  Ask your health care provider about changing or stopping your regular medicines.  This is especially important if you are taking diabetes medicines or blood thinners.  If you use an inhaler, bring it with you to the test.  Wear loose, comfortable clothing and walking shoes.  Do notuse any products that contain nicotine or tobacco, such as cigarettes and e-cigarettes, for 4 hours before the test or as told by your health care provider. If you need help quitting, ask your health care provider. What happens during the procedure?  You will take off your clothes from the waist up and put on a hospital gown.  A technician will place electrodes on your chest.  A blood pressure cuff will be placed on your arm.  You will lie down on a table for an ultrasound exam before you exercise. Gel will be rubbed on your chest, and a handheld device (transducer) will be pressed against your chest and moved over your heart.  Then, you will start exercising by walking on a treadmill or pedaling a stationary bicycle.  Your blood pressure and heart rhythm will be monitored while you exercise.  The exercise will gradually get harder or faster.  You will exercise until: ? Your heart reaches a target level. ? You are too tired to continue. ? You cannot continue because of chest pain, weakness, or dizziness.  You will have another ultrasound exam after you stop exercising. The procedure may vary among health care providers and hospitals. What happens after the procedure?  Your heart rate and blood pressure will be monitored until they return to your normal levels. Summary  An exercise stress echocardiogram is a test that uses ultrasound to check how well your heart works before and after exercise.  Before the test, follow instructions from your health care provider about stopping medications, avoiding nicotine and tobacco, and avoiding certain foods and drinks.  During the test, your blood pressure and heart rhythm will be monitored while you exercise on a treadmill or stationary  bicycle. This information is not intended to replace advice given to you by your health care provider. Make sure you discuss any questions you have with your health care provider. Document Released: 10/24/2004 Document Revised: 06/11/2016 Document Reviewed: 06/11/2016 Elsevier Interactive Patient Education  2018 [employer].

## 2017-11-13 ENCOUNTER — Other Ambulatory Visit: Payer: Self-pay

## 2017-11-13 MED ORDER — AMLODIPINE BESYLATE 10 MG PO TABS: 5.0000 mg | ORAL_TABLET | Freq: Every day | ORAL | 2 refills | Status: DC

## 2017-11-13 NOTE — Telephone Encounter (Signed)
Patient called complaining that her blood pressure has continued to remain higher in the 503'U systolically. She is concerned that her blood pressure will be higher once she is weaned off of her clonidine. Per Dr. Geraldo Pitter norvasc 5 mg daily was added to her MAR. Patient is to call her pharmacist to ensure that there will be no med interactions for this medication.

## 2017-11-26 ENCOUNTER — Other Ambulatory Visit (HOSPITAL_BASED_OUTPATIENT_CLINIC_OR_DEPARTMENT_OTHER): Payer: Managed Care, Other (non HMO)

## 2017-11-30 ENCOUNTER — Encounter: Payer: Self-pay | Admitting: Cardiology

## 2017-11-30 ENCOUNTER — Ambulatory Visit: Payer: Managed Care, Other (non HMO) | Admitting: Cardiology

## 2017-11-30 ENCOUNTER — Ambulatory Visit (INDEPENDENT_AMBULATORY_CARE_PROVIDER_SITE_OTHER): Payer: Managed Care, Other (non HMO) | Admitting: Cardiology

## 2017-11-30 ENCOUNTER — Ambulatory Visit (HOSPITAL_BASED_OUTPATIENT_CLINIC_OR_DEPARTMENT_OTHER)
Admission: RE | Admit: 2017-11-30 | Discharge: 2017-11-30 | Disposition: A | Discharge status: Home / Self Care | Payer: Managed Care, Other (non HMO) | Source: Ambulatory Visit | Attending: Cardiology | Admitting: Cardiology

## 2017-11-30 VITALS — BP 128/80 | HR 78 | Ht 62.0 in | Wt 145.0 lb

## 2017-11-30 DIAGNOSIS — R0600 Dyspnea, unspecified: Secondary | ICD-10-CM | POA: Diagnosis not present

## 2017-11-30 DIAGNOSIS — I10 Essential (primary) hypertension: Secondary | ICD-10-CM

## 2017-11-30 MED ORDER — AMLODIPINE BESYLATE 5 MG PO TABS: 5.0000 mg | ORAL_TABLET | Freq: Every day | ORAL | 1 refills | Status: DC

## 2017-11-30 NOTE — Progress Notes (Signed)
  Echocardiogram 2D Echocardiogram has been performed.  Gloria Bass T Mikhail Hallenbeck 11/30/2017, 12:03 PM

## 2017-11-30 NOTE — Patient Instructions (Signed)
Medication Instructions:  Your physician recommends that you continue on your current medications as directed. Please refer to the Current Medication list given to you today.  Refill for norvasc sent for 5 mg  Labwork: None  Testing/Procedures: None  Follow-Up: Your physician recommends that you schedule a follow-up appointment in: 3 months  Any Other Special Instructions Will Be Listed Below (If Applicable).     If you need a refill on your cardiac medications before your next appointment, please call your pharmacy.   [place], RN, BSN

## 2017-12-07 NOTE — Progress Notes (Signed)
Cardiology Office Note:    Date:  12/07/2017   ID:  Gloria Bass, DOB Oct 06, 1966, MRN [id number]  PCP:  Maylon Peppers, MD  Cardiologist:  Jenean Lindau, MD   Referring MD: Maylon Peppers, MD    ASSESSMENT:    1. Essential hypertension    PLAN:    In order of problems listed above:  1. I reassured the patient about my findings.  She is very happy that her stress test is fine and has now begun exercising on a regular basis. 2. Her blood pressure stable and he is she is worrying less about her blood pressure. 3. Her other primary prevention issues will be handled by her primary care physician.  She will be seen in follow-up appointment in 6 months or earlier if she has any concerns.   Medication Adjustments/Labs and Tests Ordered: Current medicines are reviewed at length with the patient today.  Concerns regarding medicines are outlined above.  No orders of the defined types were placed in this encounter.  Meds ordered this encounter  Medications  . amLODipine (NORVASC) 5 MG tablet    Sig: Take 1 tablet (5 mg total) by mouth daily.    Dispense:  90 tablet    Refill:  1     Chief Complaint  Patient presents with  . Follow-up    Stress Test     History of Present Illness:    Gloria Bass is a 52 y.o. female.  Patient was evaluated by me for chest pain symptoms and essential hypertension.  She has done well on the stress echo and now she feels better.  No chest pain orthopnea or PND she has now begun exercises on a regular basis.  At the time of my evaluation, the patient is alert awake oriented and in no distress.  Past Medical History:  Diagnosis Date  . Hypertension     History reviewed. No pertinent surgical history.  Current Medications: Current Meds  Medication Sig  . Cholecalciferol (VITAMIN D PO) Take by mouth.  . ethacrynic acid (EDECRIN) 25 MG tablet TK 1 TO 2 TS PO PRF SWELLING  . metoprolol succinate (TOPROL-XL) 50 MG 24 hr tablet Take 1 tablet (50 mg  total) by mouth daily. Take with or immediately following a meal.  . SUMAtriptan (IMITREX) 100 MG tablet Take 50-100 mg by mouth.  . Vitamin D, Ergocalciferol, (DRISDOL) 50000 units CAPS capsule TK 1 C PO 1 TIME A WK  . [DISCONTINUED] amLODipine (NORVASC) 10 MG tablet Take 0.5 tablets (5 mg total) by mouth daily.     Allergies:   Lisinopril and Maxzide [hydrochlorothiazide w-triamterene]   Social History   Socioeconomic History  . Marital status: Legally Separated    Spouse name: None  . Number of children: None  . Years of education: None  . Highest education level: None  Social Needs  . Financial resource strain: None  . Food insecurity - worry: None  . Food insecurity - inability: None  . Transportation needs - medical: None  . Transportation needs - non-medical: None  Occupational History  . None  Tobacco Use  . Smoking status: Never Smoker  . Smokeless tobacco: Never Used  Substance and Sexual Activity  . Alcohol use: Yes  . Drug use: No  . Sexual activity: None  Other Topics Concern  . None  Social History Narrative  . None     Family History: The patient's family history is not on file.  ROS:  Please see the history of present illness.    All other systems reviewed and are negative.  EKGs/Labs/Other Studies Reviewed:    The following studies were reviewed today: I reviewed findings of the stress echo with the patient at extensive length.   Recent Labs: No results found for requested labs within last 8760 hours.  Recent Lipid Panel No results found for: CHOL, TRIG, HDL, CHOLHDL, VLDL, LDLCALC, LDLDIRECT  Physical Exam:    VS:  BP 128/80 (BP Location: Left Arm, Patient Position: Sitting, Cuff Size: Normal)   Pulse 78   Ht 5\' 2"  (1.575 m)   Wt 145 lb (65.8 kg)   SpO2 98%   BMI 26.52 kg/m     Wt Readings from Last 3 Encounters:  11/30/17 145 lb (65.8 kg)  11/09/17 145 lb (65.8 kg)  10/12/17 145 lb (65.8 kg)     GEN: Patient is in no acute  distress HEENT: Normal NECK: No JVD; No carotid bruits LYMPHATICS: No lymphadenopathy CARDIAC: Hear sounds regular, 2/6 systolic murmur at the apex. RESPIRATORY:  Clear to auscultation without rales, wheezing or rhonchi  ABDOMEN: Soft, non-tender, non-distended MUSCULOSKELETAL:  No edema; No deformity  SKIN: Warm and dry NEUROLOGIC:  Alert and oriented x 3 PSYCHIATRIC:  Normal affect   Signed, Jenean Lindau, MD  12/07/2017 10:35 AM    [place]

## 2018-01-25 ENCOUNTER — Other Ambulatory Visit: Payer: Self-pay | Admitting: Family Medicine

## 2018-01-25 DIAGNOSIS — Z1231 Encounter for screening mammogram for malignant neoplasm of breast: Secondary | ICD-10-CM

## 2018-02-01 ENCOUNTER — Telehealth: Payer: Self-pay | Admitting: Cardiology

## 2018-02-01 NOTE — Telephone Encounter (Signed)
Patient called stating that her noticed her right hand was swollen yesterday evening; this morning she woke to find that her bottom lip had swollen. The patient stated that earlier at work today she noticed her top lip began to swell so she took 2 benadryl; this helped the swelling slightly. The patient denied feeling as if her airway was compromised. 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 The patient was informed that she should contact her PCP right away and inform them of her concerns as it is not definitive as to what is causing the swelling. Informed the patient that the benadryl should be used for the swelling as needed and to seek emergency medical treatment if her airway begins to close. Patient was agreeable and stated that she would call her PCP as soon as the call ended.

## 2018-02-01 NOTE — Telephone Encounter (Signed)
Patient would like to talk with you about the past 2 times she has had an allergic reaction and she has really swollen lips.Marland Kitchen

## 2018-02-04 ENCOUNTER — Telehealth: Payer: Self-pay | Admitting: Cardiology

## 2018-02-04 ENCOUNTER — Other Ambulatory Visit: Payer: Self-pay

## 2018-02-04 MED ORDER — METOPROLOL SUCCINATE ER 50 MG PO TB24: 50.0000 mg | ORAL_TABLET | Freq: Every day | ORAL | 3 refills | Status: DC

## 2018-02-04 NOTE — Telephone Encounter (Signed)
New Message   Patient is requesting a call back from the nurse. She states that she spoke with nurse earlier in the week about refilling or changing a prescription. But that was the information that she wanted to provide. Please call to discuss.

## 2018-02-04 NOTE — Telephone Encounter (Signed)
Patient called stating that her PCP did not feel that the lip swelling was due to her blood pressure medications and thought it might be more so foods that are causing the reaction. The patient was instructed by her PCP to let the swelling subside and then go for an allergy test. Med refill was sent for metoprolol.

## 2018-02-11 ENCOUNTER — Ambulatory Visit: Payer: Managed Care, Other (non HMO)

## 2018-02-17 LAB — HM PAP SMEAR

## 2018-02-18 ENCOUNTER — Ambulatory Visit
Admission: RE | Admit: 2018-02-18 | Discharge: 2018-02-18 | Disposition: A | Discharge status: Home / Self Care | Payer: Managed Care, Other (non HMO) | Source: Ambulatory Visit | Attending: Family Medicine | Admitting: Family Medicine

## 2018-02-18 DIAGNOSIS — Z1231 Encounter for screening mammogram for malignant neoplasm of breast: Secondary | ICD-10-CM

## 2018-03-01 ENCOUNTER — Ambulatory Visit (INDEPENDENT_AMBULATORY_CARE_PROVIDER_SITE_OTHER): Payer: Managed Care, Other (non HMO) | Admitting: Cardiology

## 2018-03-01 ENCOUNTER — Encounter: Payer: Self-pay | Admitting: Cardiology

## 2018-03-01 VITALS — BP 132/82 | HR 62 | Ht 62.0 in | Wt 147.0 lb

## 2018-03-01 DIAGNOSIS — I10 Essential (primary) hypertension: Secondary | ICD-10-CM | POA: Diagnosis not present

## 2018-03-01 NOTE — Patient Instructions (Signed)

## 2018-03-01 NOTE — Progress Notes (Signed)
Cardiology Office Note:    Date:  03/01/2018   ID:  Gloria Bass, DOB [birth year], MRN [id number]  PCP:  Maylon Peppers, MD  Cardiologist:  Jenean Lindau, MD   Referring MD: Maylon Peppers, MD    ASSESSMENT:    1. Essential hypertension    PLAN:    In order of problems listed above:  1. Primary prevention stressed with the patient.  Importance of compliance with diet and medications stressed and she vocalized understanding.  Her blood pressure is stable.  Importance of regular exercise stressed.  I told her about 30 minutes of exercise, aerobic on a regular basis at least 5 times a week and she vocalized understanding.  She will have blood work with her primary care physician.Patient will be seen in follow-up appointment in 6 months or earlier if the patient has any concerns    Medication Adjustments/Labs and Tests Ordered: Current medicines are reviewed at length with the patient today.  Concerns regarding medicines are outlined above.  No orders of the defined types were placed in this encounter.  No orders of the defined types were placed in this encounter.    Chief Complaint  Patient presents with  . Follow-up  . Hypertension     History of Present Illness:    Gloria Bass is a 52 y.o. female.  The patient has a history of essential hypertension.  She denies any problems at this time and takes care of activities of daily living.  No chest pain orthopnea or PND.  She has not yet started on a regular exercise program.  She is happy about her blood pressures.  Past Medical History:  Diagnosis Date  . Hypertension     Past Surgical History:  Procedure Laterality Date  . BRAIN TUMOR EXCISION      Current Medications: Current Meds  Medication Sig  . ethacrynic acid (EDECRIN) 25 MG tablet TK 1 TO 2 TS PO PRF SWELLING  . metoprolol succinate (TOPROL-XL) 50 MG 24 hr tablet Take 1 tablet (50 mg total) by mouth daily. Take with or immediately following a meal.  . Vitamin  D, Ergocalciferol, (DRISDOL) 50000 units CAPS capsule TK 1 C PO 1 TIME A WK     Allergies:   Lisinopril and Maxzide [hydrochlorothiazide w-triamterene]   Social History   Socioeconomic History  . Marital status: Legally Separated    Spouse name: Not on file  . Number of children: Not on file  . Years of education: Not on file  . Highest education level: Not on file  Occupational History  . Not on file  Social Needs  . Financial resource strain: Not on file  . Food insecurity:    Worry: Not on file    Inability: Not on file  . Transportation needs:    Medical: Not on file    Non-medical: Not on file  Tobacco Use  . Smoking status: Never Smoker  . Smokeless tobacco: Never Used  Substance and Sexual Activity  . Alcohol use: Yes  . Drug use: No  . Sexual activity: Not on file  Lifestyle  . Physical activity:    Days per week: Not on file    Minutes per session: Not on file  . Stress: Not on file  Relationships  . Social connections:    Talks on phone: Not on file    Gets together: Not on file    Attends religious service: Not on file    Active member of club  or organization: Not on file    Attends meetings of clubs or organizations: Not on file    Relationship status: Not on file  Other Topics Concern  . Not on file  Social History Narrative  . Not on file     Family History: The patient's family history includes Hypertension in her father and mother.  ROS:   Please see the history of present illness.    All other systems reviewed and are negative.  EKGs/Labs/Other Studies Reviewed:    The following studies were reviewed today: I discussed my findings with the patient at extensive length.   Recent Labs: No results found for requested labs within last 8760 hours.  Recent Lipid Panel No results found for: CHOL, TRIG, HDL, CHOLHDL, VLDL, LDLCALC, LDLDIRECT  Physical Exam:    VS:  BP 132/82 (BP Location: Left Arm, Patient Position: Sitting, Cuff Size:  Normal)   Pulse 62   Ht 5\' 2"  (1.575 m)   Wt 147 lb (66.7 kg)   SpO2 98%   BMI 26.89 kg/m     Wt Readings from Last 3 Encounters:  03/01/18 147 lb (66.7 kg)  11/30/17 145 lb (65.8 kg)  11/09/17 145 lb (65.8 kg)     GEN: Patient is in no acute distress HEENT: Normal NECK: No JVD; No carotid bruits LYMPHATICS: No lymphadenopathy CARDIAC: Hear sounds regular, 2/6 systolic murmur at the apex. RESPIRATORY:  Clear to auscultation without rales, wheezing or rhonchi  ABDOMEN: Soft, non-tender, non-distended MUSCULOSKELETAL:  No edema; No deformity  SKIN: Warm and dry NEUROLOGIC:  Alert and oriented x 3 PSYCHIATRIC:  Normal affect   Signed, Jenean Lindau, MD  03/01/2018 4:09 PM    [facility]

## 2018-05-12 NOTE — H&P (Signed)
Patient name Gloria Bass, Gloria Bass DICTATION# [id number] CSN# [id number]  [facility], MD 05/12/2018 3:24 PM

## 2018-05-13 NOTE — H&P (Signed)
NAME: Gloria Bass, Gloria Bass MEDICAL RECORD [id number] ACCOUNT [id number] DATE OF BIRTH:May 11, 1966 FACILITY: [facility] LOCATION:  PHYSICIAN:Woodroe Vogan Sherran Needs, MD  HISTORY AND PHYSICAL  DATE OF ADMISSION:  06/03/2018  Date of surgery is 06/03/2018 at [facility] outpatient area.  HISTORY OF PRESENT ILLNESS:  The patient is a 52 year old gravida 1, para 1 female who presents for hysteroscopy with D and C.  The patient is postmenopausal, had an episode of postmenopausal bleeding.  On ultrasound evaluation, she had a slightly  thickened endometrium, but because of the extremely stenotic cervix, we could not perform a sonohysterogram or endometrial biopsy.  She now comes in for that.  ALLERGIES:  In terms of allergies, she is allergic to LISINOPRIL AND MAXZIDE.  MEDICATIONS:  She is on Catapres 0.1 mg tablet 2 times daily, Edecrin, 1-2 tablets p.o. as needed for swelling, Imitrex 50 to 100 mg with headaches.  PAST MEDICAL HISTORY:  She has a history of hypertension for which she is under active management.  She has had 1 spontaneous vaginal delivery.  Does see a cardiologist for management of the hypertension.  SOCIAL HISTORY:  No tobacco or alcohol use.  FAMILY HISTORY:  Noncontributory.  REVIEW OF SYSTEMS:  Noncontributory.  PHYSICAL EXAMINATION: VITAL SIGNS:  Afebrile, stable vital signs. HEENT:  The patient is normocephalic.  Pupils equal, round, reactive to light and accommodation.  Extraocular movements are intact.  Sclerae and conjunctivae clear.  Oropharynx clear. NECK:  Without thyromegaly. BREASTS:  Not examined. LUNGS:  Clear. HEART:  Regular rate and rhythm.  No murmurs or gallops.  No carotid or abdominal  bruits.   ABDOMEN:  Abdominal exam is benign. PELVIC:  Normal external genitalia.  Vaginal mucosa is clear.  Cervix unremarkable.  Uterus normal size, shape and contour.  Adnexa free of mass or tenderness. EXTREMITIES:  Trace edema. NEUROLOGIC:  Grossly normal limits.  ASSESSMENT:    1.  Postmenopausal bleeding with stenotic cervix and endometrial thickening. 2.  Essential hypertension.  PLAN:  The patient will undergo a cervical dilation, hysteroscopy with resection of endometrial tissue along with curettings.  The nature of the surgery have been discussed.  The risks have been explained including the risk of infection.  Risk of  hemorrhage that could require transfusion with the risk of AIDS or hepatitis.  Excessive bleeding could require hysterectomy.  Risk of injury to adjacent organs or perforation could require laparoscopy and possible exploratory start surgery for repair,  risk of deep venous thrombosis and pulmonary embolus.  The patient expressed understanding of indications and risk.  AN/NUANCE  D:05/12/2018 T:05/12/2018 JOB:[id number]

## 2018-05-31 ENCOUNTER — Encounter (HOSPITAL_BASED_OUTPATIENT_CLINIC_OR_DEPARTMENT_OTHER): Payer: Self-pay | Admitting: *Deleted

## 2018-05-31 ENCOUNTER — Other Ambulatory Visit: Payer: Self-pay

## 2018-05-31 NOTE — Progress Notes (Signed)
Spoke w/ pt via phone for pre-op interview.  Npo after mn.  Arrive at 0530.  Getting lab work done Tuesday 06-01-2018 @ 1400 (cbc, bmet).  Current ekg in chart and epic.  Will take toprol and norvasc am dos w/ sips of water.

## 2018-06-01 ENCOUNTER — Encounter (HOSPITAL_COMMUNITY)
Admission: RE | Admit: 2018-06-01 | Discharge: 2018-06-01 | Disposition: A | Discharge status: Home / Self Care | Payer: Managed Care, Other (non HMO) | Source: Ambulatory Visit | Attending: Obstetrics and Gynecology | Admitting: Obstetrics and Gynecology

## 2018-06-01 DIAGNOSIS — J45909 Unspecified asthma, uncomplicated: Secondary | ICD-10-CM | POA: Diagnosis not present

## 2018-06-01 DIAGNOSIS — N840 Polyp of corpus uteri: Secondary | ICD-10-CM | POA: Diagnosis not present

## 2018-06-01 DIAGNOSIS — Z79899 Other long term (current) drug therapy: Secondary | ICD-10-CM | POA: Diagnosis not present

## 2018-06-01 DIAGNOSIS — N882 Stricture and stenosis of cervix uteri: Secondary | ICD-10-CM | POA: Diagnosis not present

## 2018-06-01 DIAGNOSIS — R51 Headache: Secondary | ICD-10-CM | POA: Diagnosis not present

## 2018-06-01 DIAGNOSIS — F419 Anxiety disorder, unspecified: Secondary | ICD-10-CM | POA: Diagnosis not present

## 2018-06-01 DIAGNOSIS — N950 Postmenopausal bleeding: Secondary | ICD-10-CM | POA: Diagnosis not present

## 2018-06-01 DIAGNOSIS — I10 Essential (primary) hypertension: Secondary | ICD-10-CM | POA: Diagnosis not present

## 2018-06-01 LAB — BASIC METABOLIC PANEL
Anion gap: 7 (ref 5–15)
BUN: 14 mg/dL (ref 6–20)
CO2: 28 mmol/L (ref 22–32)
Calcium: 9.6 mg/dL (ref 8.9–10.3)
Chloride: 108 mmol/L (ref 98–111)
Creatinine, Ser: 0.73 mg/dL (ref 0.44–1.00)
GFR calc Af Amer: >60 mL/min (ref >60)
GFR calc non Af Amer: >60 mL/min (ref >60)
Glucose, Bld: 86 mg/dL (ref 70–99)
Potassium: 4.3 mmol/L (ref 3.5–5.1)
Sodium: 143 mmol/L (ref 135–145)

## 2018-06-01 LAB — CBC
HCT: 42.8 % (ref 36.0–46.0)
Hemoglobin: 14.1 g/dL (ref 12.0–15.0)
MCH: 30.2 pg (ref 26.0–34.0)
MCHC: 32.9 g/dL (ref 30.0–36.0)
MCV: 91.6 fL (ref 78.0–100.0)
Platelets: 300 10*3/uL (ref 150–400)
RBC: 4.67 MIL/uL (ref 3.87–5.11)
RDW: 13 % (ref 11.5–15.5)
WBC: 5.3 10*3/uL (ref 4.0–10.5)

## 2018-06-02 DIAGNOSIS — N950 Postmenopausal bleeding: Secondary | ICD-10-CM | POA: Diagnosis not present

## 2018-06-02 NOTE — Anesthesia Preprocedure Evaluation (Addendum)
Anesthesia Evaluation  Patient identified by MRN, date of birth, ID band Patient awake    Airway Mallampati: II  TM Distance: >3 FB Neck ROM: full    Dental no notable dental hx. (+) Teeth Intact, Dental Advidsory Given   Pulmonary asthma ,    Pulmonary exam normal        Cardiovascular hypertension, Pt. on medications and Pt. on home beta blockers  Rhythm:regular Rate:Normal     Neuro/Psych  Headaches, Anxiety    GI/Hepatic negative GI ROS, Neg liver ROS,   Endo/Other  negative endocrine ROS  Renal/GU negative Renal ROS     Musculoskeletal   Abdominal   Peds  Hematology negative hematology ROS (+)   Anesthesia Other Findings   Reproductive/Obstetrics                            Lab Results  Component Value Date   WBC 5.3 06/01/2018   HGB 14.1 06/01/2018   HCT 42.8 06/01/2018   MCV 91.6 06/01/2018   PLT 300 06/01/2018   Lab Results  Component Value Date   CREATININE 0.73 06/01/2018   BUN 14 06/01/2018   NA 143 06/01/2018   K 4.3 06/01/2018   CL 108 06/01/2018   CO2 28 06/01/2018    Anesthesia Physical Anesthesia Plan  ASA: II  Anesthesia Plan: General   Post-op Pain Management:    Induction: Intravenous  PONV Risk Score and Plan:   Airway Management Planned: LMA  Additional Equipment:   Intra-op Plan:   Post-operative Plan: Extubation in OR  Informed Consent:   Plan Discussed with: CRNA and Anesthesiologist  Anesthesia Plan Comments:        Anesthesia Quick Evaluation

## 2018-06-03 ENCOUNTER — Ambulatory Visit (HOSPITAL_BASED_OUTPATIENT_CLINIC_OR_DEPARTMENT_OTHER)
Admission: RE | Admit: 2018-06-03 | Discharge: 2018-06-03 | Disposition: A | Discharge status: Home / Self Care | Payer: Managed Care, Other (non HMO) | Source: Ambulatory Visit | Attending: Obstetrics and Gynecology | Admitting: Obstetrics and Gynecology

## 2018-06-03 ENCOUNTER — Encounter (HOSPITAL_BASED_OUTPATIENT_CLINIC_OR_DEPARTMENT_OTHER)
Admission: RE | Disposition: A | Discharge status: Home / Self Care | Payer: Self-pay | Source: Ambulatory Visit | Attending: Obstetrics and Gynecology

## 2018-06-03 ENCOUNTER — Ambulatory Visit (HOSPITAL_BASED_OUTPATIENT_CLINIC_OR_DEPARTMENT_OTHER): Payer: Managed Care, Other (non HMO) | Admitting: Anesthesiology

## 2018-06-03 ENCOUNTER — Encounter (HOSPITAL_BASED_OUTPATIENT_CLINIC_OR_DEPARTMENT_OTHER): Payer: Self-pay | Admitting: *Deleted

## 2018-06-03 DIAGNOSIS — N840 Polyp of corpus uteri: Secondary | ICD-10-CM

## 2018-06-03 DIAGNOSIS — F419 Anxiety disorder, unspecified: Secondary | ICD-10-CM | POA: Insufficient documentation

## 2018-06-03 DIAGNOSIS — R51 Headache: Secondary | ICD-10-CM | POA: Insufficient documentation

## 2018-06-03 DIAGNOSIS — N950 Postmenopausal bleeding: Secondary | ICD-10-CM | POA: Diagnosis not present

## 2018-06-03 DIAGNOSIS — J45909 Unspecified asthma, uncomplicated: Secondary | ICD-10-CM | POA: Insufficient documentation

## 2018-06-03 DIAGNOSIS — Z79899 Other long term (current) drug therapy: Secondary | ICD-10-CM | POA: Insufficient documentation

## 2018-06-03 DIAGNOSIS — I10 Essential (primary) hypertension: Secondary | ICD-10-CM | POA: Insufficient documentation

## 2018-06-03 DIAGNOSIS — N882 Stricture and stenosis of cervix uteri: Secondary | ICD-10-CM | POA: Insufficient documentation

## 2018-06-03 HISTORY — DX: Polyp of corpus uteri: N84.0

## 2018-06-03 HISTORY — DX: Postmenopausal bleeding: N95.0

## 2018-06-03 HISTORY — DX: Migraine, unspecified, not intractable, without status migrainosus: G43.909

## 2018-06-03 HISTORY — DX: Personal history of other specified conditions: Z87.898

## 2018-06-03 HISTORY — DX: Allergic rhinitis, unspecified: J30.9

## 2018-06-03 HISTORY — PX: HYSTEROSCOPY WITH D & C: SHX1775

## 2018-06-03 HISTORY — DX: Stricture and stenosis of cervix uteri: N88.2

## 2018-06-03 SURGERY — DILATATION AND CURETTAGE /HYSTEROSCOPY
Anesthesia: General | Site: Uterus

## 2018-06-03 MED ORDER — MIDAZOLAM HCL 2 MG/2ML IJ SOLN
INTRAMUSCULAR | Status: AC
  Filled 2018-06-03: qty 2

## 2018-06-03 MED ORDER — LIDOCAINE 2% (20 MG/ML) 5 ML SYRINGE
INTRAMUSCULAR | Status: DC | PRN
  Administered 2018-06-03: 60 mg via INTRAVENOUS

## 2018-06-03 MED ORDER — KETOROLAC TROMETHAMINE 30 MG/ML IJ SOLN
INTRAMUSCULAR | Status: DC | PRN
  Administered 2018-06-03: 30 mg via INTRAVENOUS

## 2018-06-03 MED ORDER — PROPOFOL 10 MG/ML IV BOLUS
INTRAVENOUS | Status: AC
  Filled 2018-06-03: qty 40

## 2018-06-03 MED ORDER — LACTATED RINGERS IV SOLN
INTRAVENOUS | Status: DC
  Administered 2018-06-03 (×2): via INTRAVENOUS
  Filled 2018-06-03: qty 1000

## 2018-06-03 MED ORDER — FENTANYL CITRATE (PF) 100 MCG/2ML IJ SOLN
INTRAMUSCULAR | Status: AC
Start: 2018-06-03 — End: ?
  Filled 2018-06-03: qty 2

## 2018-06-03 MED ORDER — CEFAZOLIN SODIUM-DEXTROSE 2-4 GM/100ML-% IV SOLN
2.0000 g | INTRAVENOUS | Status: AC
  Administered 2018-06-03: 2 g via INTRAVENOUS
  Filled 2018-06-03: qty 100

## 2018-06-03 MED ORDER — SODIUM CHLORIDE 0.9 % IR SOLN
Status: DC | PRN
  Administered 2018-06-03: 3000 mL

## 2018-06-03 MED ORDER — PROPOFOL 10 MG/ML IV BOLUS
INTRAVENOUS | Status: DC | PRN
  Administered 2018-06-03: 170 mg via INTRAVENOUS

## 2018-06-03 MED ORDER — PROMETHAZINE HCL 25 MG/ML IJ SOLN
6.2500 mg | INTRAMUSCULAR | Status: DC | PRN
  Filled 2018-06-03: qty 1

## 2018-06-03 MED ORDER — FENTANYL CITRATE (PF) 100 MCG/2ML IJ SOLN
INTRAMUSCULAR | Status: DC | PRN
  Administered 2018-06-03: 50 ug via INTRAVENOUS

## 2018-06-03 MED ORDER — DEXAMETHASONE SODIUM PHOSPHATE 10 MG/ML IJ SOLN
INTRAMUSCULAR | Status: DC | PRN
  Administered 2018-06-03: 10 mg via INTRAVENOUS

## 2018-06-03 MED ORDER — CEFAZOLIN SODIUM-DEXTROSE 2-4 GM/100ML-% IV SOLN
INTRAVENOUS | Status: AC
  Filled 2018-06-03: qty 100

## 2018-06-03 MED ORDER — ONDANSETRON HCL 4 MG/2ML IJ SOLN
INTRAMUSCULAR | Status: DC | PRN
  Administered 2018-06-03: 4 mg via INTRAVENOUS

## 2018-06-03 MED ORDER — LIDOCAINE 2% (20 MG/ML) 5 ML SYRINGE
INTRAMUSCULAR | Status: AC
  Filled 2018-06-03: qty 5

## 2018-06-03 MED ORDER — DEXAMETHASONE SODIUM PHOSPHATE 10 MG/ML IJ SOLN
INTRAMUSCULAR | Status: AC
  Filled 2018-06-03: qty 1

## 2018-06-03 MED ORDER — MIDAZOLAM HCL 2 MG/2ML IJ SOLN
INTRAMUSCULAR | Status: DC | PRN
  Administered 2018-06-03: 2 mg via INTRAVENOUS

## 2018-06-03 MED ORDER — WHITE PETROLATUM EX OINT
TOPICAL_OINTMENT | CUTANEOUS | Status: AC
  Filled 2018-06-03: qty 20

## 2018-06-03 MED ORDER — FENTANYL CITRATE (PF) 100 MCG/2ML IJ SOLN
25.0000 ug | INTRAMUSCULAR | Status: DC | PRN
  Filled 2018-06-03: qty 1

## 2018-06-03 MED ORDER — ONDANSETRON HCL 4 MG/2ML IJ SOLN
INTRAMUSCULAR | Status: AC
  Filled 2018-06-03: qty 2

## 2018-06-03 MED ORDER — KETOROLAC TROMETHAMINE 30 MG/ML IJ SOLN
INTRAMUSCULAR | Status: AC
  Filled 2018-06-03: qty 1

## 2018-06-03 SURGICAL SUPPLY — 22 items
BIPOLAR CUTTING LOOP 21FR (ELECTRODE)
CANISTER SUCT 3000ML PPV (MISCELLANEOUS) ×3 IMPLANT
CATH ROBINSON RED A/P 16FR (CATHETERS) ×2 IMPLANT
COUNTER NEEDLE 1200 MAGNETIC (NEEDLE) ×1 IMPLANT
DEVICE MYOSURE LITE (MISCELLANEOUS) ×1 IMPLANT
DILATOR CANAL MILEX (MISCELLANEOUS) IMPLANT
GLOVE BIO SURGEON STRL SZ7 (GLOVE) ×4 IMPLANT
GOWN STRL REUS W/TWL XL LVL3 (GOWN DISPOSABLE) ×3 IMPLANT
IV NS IRRIG 3000ML ARTHROMATIC (IV SOLUTION) ×3 IMPLANT
KIT TURNOVER CYSTO (KITS) ×2 IMPLANT
LOOP CUTTING BIPOLAR 21FR (ELECTRODE) IMPLANT
NDL SPNL 18GX3.5 QUINCKE PK (NEEDLE) ×1 IMPLANT
NEEDLE SPNL 18GX3.5 QUINCKE PK (NEEDLE) ×2 IMPLANT
NS IRRIG 500ML POUR BTL (IV SOLUTION) ×1 IMPLANT
PACK VAGINAL MINOR WOMEN LF (CUSTOM PROCEDURE TRAY) ×2 IMPLANT
PAD OB MATERNITY 4.3X12.25 (PERSONAL CARE ITEMS) ×2 IMPLANT
PAD PREP 24X48 CUFFED NSTRL (MISCELLANEOUS) ×2 IMPLANT
SEAL ROD LENS SCOPE MYOSURE (ABLATOR) ×1 IMPLANT
TOWEL OR 17X24 6PK STRL BLUE (TOWEL DISPOSABLE) ×4 IMPLANT
TUBING AQUILEX INFLOW (TUBING) ×2 IMPLANT
TUBING AQUILEX OUTFLOW (TUBING) ×2 IMPLANT
WATER STERILE IRR 500ML POUR (IV SOLUTION) ×1 IMPLANT

## 2018-06-03 NOTE — H&P (Signed)
  History and physical exam unchanged 

## 2018-06-03 NOTE — Transfer of Care (Signed)
Immediate Anesthesia Transfer of Care Note  Patient: Gloria Bass  Procedure(s) Performed: DILATATION AND CURETTAGE /HYSTEROSCOPY (N/A Uterus)  Patient Location: PACU  Anesthesia Type:General  Level of Consciousness: awake, alert , oriented and patient cooperative  Airway & Oxygen Therapy: Patient Spontanous Breathing and Patient connected to nasal cannula oxygen  Post-op Assessment: Report given to RN and Post -op Vital signs reviewed and stable  Post vital signs: Reviewed and stable  Last Vitals:  Vitals Value Taken Time  BP    Temp    Pulse 62 06/03/2018  8:00 AM  Resp    SpO2 100 % 06/03/2018  8:00 AM  Vitals shown include unvalidated device data.  Last Pain:  Vitals:   06/03/18 0555  TempSrc:   PainSc: 0-No pain      Patients Stated Pain Goal: 7 (15/61/53 7943)  Complications: No apparent anesthesia complications

## 2018-06-03 NOTE — Progress Notes (Signed)
Patient name  Gloria Bass, hebel DICTATION# [id number] CSN# [id number]  [facility], MD 06/03/2018 7:51 AM     Patient ID: Gloria Bass, female   DOB: 1966-07-05, 52 y.o.   MRN: [id number]

## 2018-06-03 NOTE — Anesthesia Procedure Notes (Signed)
Procedure Name: LMA Insertion Date/Time: 06/03/2018 7:27 AM Performed by: Wanita Chamberlain, CRNA Pre-anesthesia Checklist: Patient identified, Emergency Drugs available, Suction available, Patient being monitored and Timeout performed Patient Re-evaluated:Patient Re-evaluated prior to induction Oxygen Delivery Method: Circle system utilized Preoxygenation: Pre-oxygenation with 100% oxygen Induction Type: IV induction Ventilation: Mask ventilation without difficulty LMA: LMA inserted LMA Size: 4.0 Number of attempts: 1 Placement Confirmation: breath sounds checked- equal and bilateral,  CO2 detector and positive ETCO2

## 2018-06-03 NOTE — Discharge Instructions (Signed)
° °  D & C Home care Instructions:   Personal hygiene:  Used sanitary napkins for vaginal drainage not tampons. Shower or tub bathe the day after your procedure. No douching until bleeding stops. Always wipe from front to back after  Elimination.  Activity: Do not drive or operate any equipment today. The effects of the anesthesia are still present and drowsiness may result. Rest today, not necessarily flat bed rest, just take it easy. You may resume your normal activity in one to 2 days.  Sexual activity: No intercourse for one week or as indicated by your physician  Diet: Eat a light diet as desired this evening. You may resume a regular diet tomorrow.  Return to work: One to 2 days.  General Expectations of your surgery: Vaginal bleeding should be no heavier than a normal period. Spotting may continue up to 10 days. Mild cramps may continue for a couple of days. You may have a regular period in 2-6 weeks.  Unexpected observations call your doctor if these occur: persistent or heavy bleeding. Severe abdominal cramping or pain. Elevation of temperature greater than 100F.   Post Anesthesia Home Care Instructions  Activity: Get plenty of rest for the remainder of the day. A responsible individual must stay with you for 24 hours following the procedure.  For the next 24 hours, DO NOT: -Drive a car -Paediatric nurse -Drink alcoholic beverages -Take any medication unless instructed by your physician -Make any legal decisions or sign important papers.  Meals: Start with liquid foods such as gelatin or soup. Progress to regular foods as tolerated. Avoid greasy, spicy, heavy foods. If nausea and/or vomiting occur, drink only clear liquids until the nausea and/or vomiting subsides. Call your physician if vomiting continues.  Special Instructions/Symptoms: Your throat may feel dry or sore from the anesthesia or the breathing tube placed in your throat during surgery. If this causes  discomfort, gargle with warm salt water. The discomfort should disappear within 24 hours.    No Ibuprofen, Advil, Motrin, or Aleve until 2 PM.

## 2018-06-03 NOTE — Op Note (Signed)
NAMELuree, Bass [facility] MEDICAL RECORD [id number] ACCOUNT [id number] DATE OF BIRTH:January 14, 1966 FACILITY: [facility] LOCATION: [facility] PHYSICIAN:Keval Nam Sherran Needs, MD  OPERATIVE REPORT  DATE OF PROCEDURE:  06/03/2018  PREOPERATIVE DIAGNOSIS:  Postmenopausal bleeding was extremely stenotic cervix.  POSTOPERATIVE DIAGNOSIS:  Postmenopausal bleeding was extremely stenotic cervix with finding of an endometrial polyp.  PROCEDURES:  Paracervical block, cervical dilation, hysteroscopy with MyoSure resection of endometrial polyp and endometrial curetting.  SURGEON:  Darlyn Chamber, MD  ANESTHESIA:  Paracervical block and general.  ESTIMATED BLOOD LOSS:  Minimal.  PACKS AND DRAINS:  None.  INTRAOPERATIVE BLOOD PLACED:  None.  SPECIMENS:  None.  INDICATIONS:  Dictated per history and physical.  DESCRIPTION OF PROCEDURE:  The patient was taken to the OR and placed in supine position.  After satisfactory level of general anesthesia was obtained, she was placed in the dorsal lithotomy position using the Allen stirrups.  Perineum and vagina were  prepped out with Betadine to prepare a sterile field.  Speculum was placed in the vaginal vault.  Cervix grasped with single tooth tenaculum.  Cervix was stenotic.  A paracervical block was administered using 1% Xylocaine.  We were eventually able to  dilate the cervix, did so progressively.  The hysteroscope was then introduced.  Visualization revealed a fundal endometrial polyp.  Hysteroscope was brought and placed on the MyoSure.  The polyp was completely resected.  The remaining endometrium seemed  smooth and atrophic, but we did do endometrial curettings.  Total deficit was minimal.  She was having no active bleeding.  Single tooth tenaculum and speculum removed.    The patient was taken out of dorsal lithotomy position, was alert and extubated and transferred to recovery room in good condition.  Sponge, instrument and needle counts were correct by the  circulating nurse.  AN/NUANCE  D:06/03/2018 T:06/03/2018 JOB:[id number]

## 2018-06-04 ENCOUNTER — Encounter (HOSPITAL_BASED_OUTPATIENT_CLINIC_OR_DEPARTMENT_OTHER): Payer: Self-pay | Admitting: Obstetrics and Gynecology

## 2018-06-04 NOTE — Anesthesia Postprocedure Evaluation (Signed)
Anesthesia Post Note  Patient: Gloria Bass  Procedure(s) Performed: DILATATION AND CURETTAGE /HYSTEROSCOPY (N/A Uterus)     Patient location during evaluation: PACU Anesthesia Type: General Level of consciousness: awake and alert Pain management: pain level controlled Vital Signs Assessment: post-procedure vital signs reviewed and stable Respiratory status: spontaneous breathing, nonlabored ventilation, respiratory function stable and patient connected to nasal cannula oxygen Cardiovascular status: blood pressure returned to baseline and stable Postop Assessment: no apparent nausea or vomiting Anesthetic complications: no    Last Vitals:  Vitals:   06/03/18 0830 06/03/18 0947  BP: 125/76 138/83  Pulse: (!) 51 (!) 56  Resp: 12   Temp:  37.1 C  SpO2: 100% 100%    Last Pain:  Vitals:   06/04/18 0939  TempSrc:   PainSc: 0-No pain                 Tiajuana Amass

## 2018-07-19 ENCOUNTER — Other Ambulatory Visit: Payer: Self-pay | Admitting: Cardiology

## 2018-09-29 ENCOUNTER — Telehealth: Payer: Self-pay | Admitting: Cardiology

## 2018-09-29 ENCOUNTER — Other Ambulatory Visit: Payer: Self-pay

## 2018-09-29 MED ORDER — AMLODIPINE BESYLATE 5 MG PO TABS: 7.5000 mg | ORAL_TABLET | Freq: Every day | ORAL | 0 refills | Status: DC

## 2018-09-29 NOTE — Telephone Encounter (Signed)
Per patient her pulse has been stable and the only side effects she has been experiencing is a flushed feeling in her ears when her BP is high. Instructed to increase amlodipine to 7.5 mg q daily. Patient will follow up next week for her 6 month follow up.

## 2018-09-29 NOTE — Telephone Encounter (Signed)
Please call patient regarding BP. Pressure is 150/97 now and for the last three days. Please advise.

## 2018-10-08 ENCOUNTER — Ambulatory Visit: Payer: Managed Care, Other (non HMO) | Admitting: Cardiology

## 2018-10-08 ENCOUNTER — Encounter: Payer: Self-pay | Admitting: Cardiology

## 2018-10-08 VITALS — BP 126/72 | HR 74 | Ht 62.5 in | Wt 146.0 lb

## 2018-10-08 DIAGNOSIS — I10 Essential (primary) hypertension: Secondary | ICD-10-CM | POA: Diagnosis not present

## 2018-10-08 NOTE — Progress Notes (Signed)
Cardiology Office Note:    Date:  10/08/2018   ID:  Gloria Bass, DOB 20-Sep-1966, MRN [id number]  PCP:  Maylon Peppers, MD  Cardiologist:  Jenean Lindau, MD   Referring MD: Maylon Peppers, MD    ASSESSMENT:    1. Essential hypertension    PLAN:    In order of problems listed above:  1. Primary prevention stressed with the patient.  Importance of compliance with diet and medication stressed and she vocalized understanding.  Her blood pressure is now stable.  Importance of regular exercise stressed at least 30 minutes of aerobic exercise at least 5 times a week she vocalized understanding. 2. She is fasting today and requests blood work and we will do this including lipids and advise her accordingly. 3. Patient will be seen in follow-up appointment in 6 months or earlier if the patient has any concerns    Medication Adjustments/Labs and Tests Ordered: Current medicines are reviewed at length with the patient today.  Concerns regarding medicines are outlined above.  Orders Placed This Encounter  Procedures  . Basic metabolic panel  . CBC  . TSH  . Hepatic function panel  . Lipid panel   No orders of the defined types were placed in this encounter.    No chief complaint on file.    History of Present Illness:    Gloria Bass is a 52 y.o. female.  Patient has past medical history of essential hypertension.  She denies any problems at this time and takes care of activities of daily living.  No chest pain orthopnea or PND.  At the time of my evaluation, the patient is alert awake oriented and in no distress.  She is leading a sedentary lifestyle and is busy at work and not exercising on a regular basis.  She feels her blood pressure was elevated 2 weeks ago but she was having a lot of food at different places with her friends for the festival season.  Past Medical History:  Diagnosis Date  . Allergic rhinitis   . Cervical stenosis (uterine cervix)   . History of pituitary  tumor    prolactinoma  . Hypertension   . Migraines   . PMB (postmenopausal bleeding)     Past Surgical History:  Procedure Laterality Date  . COLONOSCOPY WITH PROPOFOL  2018  . DOBUTAMINE STRESS ECHO  11-30-2017   dr Geraldo Pitter   normal withou evidence ischemia/  normal LVEF  . HYSTEROSCOPY W/D&C N/A 06/03/2018   Procedure: DILATATION AND CURETTAGE /HYSTEROSCOPY;  Surgeon: Arvella Nigh, MD;  Location: [facility];  Service: Gynecology;  Laterality: N/A;  . TRANSPHENOIDAL PITUITARY RESECTION  age 81  @ [facility]   benign    Current Medications: Current Meds  Medication Sig  . amLODipine (NORVASC) 5 MG tablet Take 1.5 tablets (7.5 mg total) by mouth daily.  Marland Kitchen ethacrynic acid (EDECRIN) 25 MG tablet TK 1 TO 2 TS PO PRF SWELLING--- as needed     Allergies:   Lisinopril   Social History   Socioeconomic History  . Marital status: Legally Separated    Spouse name: Not on file  . Number of children: Not on file  . Years of education: Not on file  . Highest education level: Not on file  Occupational History  . Not on file  Social Needs  . Financial resource strain: Not on file  . Food insecurity:    Worry: Not on file    Inability: Not on file  .  Transportation needs:    Medical: Not on file    Non-medical: Not on file  Tobacco Use  . Smoking status: Never Smoker  . Smokeless tobacco: Never Used  Substance and Sexual Activity  . Alcohol use: Yes    Comment: occasional  . Drug use: No  . Sexual activity: Not on file  Lifestyle  . Physical activity:    Days per week: Not on file    Minutes per session: Not on file  . Stress: Not on file  Relationships  . Social connections:    Talks on phone: Not on file    Gets together: Not on file    Attends religious service: Not on file    Active member of club or organization: Not on file    Attends meetings of clubs or organizations: Not on file    Relationship status: Not on file  Other Topics Concern  . Not on  file  Social History Narrative  . Not on file     Family History: The patient's family history includes Hypertension in her father and mother.  ROS:   Please see the history of present illness.    All other systems reviewed and are negative.  EKGs/Labs/Other Studies Reviewed:    The following studies were reviewed today: I discussed my findings with the patient at extensive length.  Next   Recent Labs: 06/01/2018: BUN 14; Creatinine, Ser 0.73; Hemoglobin 14.1; Platelets 300; Potassium 4.3; Sodium 143  Recent Lipid Panel No results found for: CHOL, TRIG, HDL, CHOLHDL, VLDL, LDLCALC, LDLDIRECT  Physical Exam:    VS:  BP 126/72 (BP Location: Right Arm, Patient Position: Sitting, Cuff Size: Normal)   Pulse 74   Ht 5' 2.5" (1.588 m)   Wt 146 lb (66.2 kg)   SpO2 98%   BMI 26.28 kg/m     Wt Readings from Last 3 Encounters:  10/08/18 146 lb (66.2 kg)  06/03/18 147 lb (66.7 kg)  03/01/18 147 lb (66.7 kg)     GEN: Patient is in no acute distress HEENT: Normal NECK: No JVD; No carotid bruits LYMPHATICS: No lymphadenopathy CARDIAC: Hear sounds regular, 2/6 systolic murmur at the apex. RESPIRATORY:  Clear to auscultation without rales, wheezing or rhonchi  ABDOMEN: Soft, non-tender, non-distended MUSCULOSKELETAL:  No edema; No deformity  SKIN: Warm and dry NEUROLOGIC:  Alert and oriented x 3 PSYCHIATRIC:  Normal affect   Signed, Jenean Lindau, MD  10/08/2018 9:35 AM    [place]

## 2018-10-08 NOTE — Patient Instructions (Signed)
Medication Instructions:  Your physician recommends that you continue on your current medications as directed. Please refer to the Current Medication list given to you today.  If you need a refill on your cardiac medications before your next appointment, please call your pharmacy.   Lab work: Your physician recommends that you return for lab work: bmp, cbc, tsh, lft, and lipids.   If you have labs (blood work) drawn today and your tests are completely normal, you will receive your results only by: Marland Kitchen MyChart Message (if you have MyChart) OR . A paper copy in the mail If you have any lab test that is abnormal or we need to change your treatment, we will call you to review the results.  Testing/Procedures: None.   Follow-Up: At [facility], you and your health needs are our priority.  As part of our continuing mission to provide you with exceptional heart care, we have created designated Provider Care Teams.  These Care Teams include your primary Cardiologist (physician) and Advanced Practice Providers (APPs -  Physician Assistants and Nurse Practitioners) who all work together to provide you with the care you need, when you need it. You will need a follow up appointment in 6 months.  Please call our office 2 months in advance to schedule this appointment.  You may see No primary care provider on file. or another member of our [employer] [facility] [place]: Jenne Campus, MD . Shirlee More, MD  Any Other Special Instructions Will Be Listed Below (If Applicable).

## 2018-10-09 LAB — BASIC METABOLIC PANEL
BUN/Creatinine Ratio: 9 (ref 9–23)
BUN: 7 mg/dL (ref 6–24)
CO2: 25 mmol/L (ref 20–29)
Calcium: 9.6 mg/dL (ref 8.7–10.2)
Chloride: 106 mmol/L (ref 96–106)
Creatinine, Ser: 0.76 mg/dL (ref 0.57–1.00)
GFR calc Af Amer: 104 mL/min/{1.73_m2} (ref >59)
GFR calc non Af Amer: 90 mL/min/{1.73_m2} (ref >59)
Glucose: 89 mg/dL (ref 65–99)
Potassium: 4.1 mmol/L (ref 3.5–5.2)
Sodium: 145 mmol/L — ABNORMAL HIGH (ref 134–144)

## 2018-10-09 LAB — HEPATIC FUNCTION PANEL
ALT: 19 IU/L (ref 0–32)
AST: 19 IU/L (ref 0–40)
Albumin: 4.6 g/dL (ref 3.5–5.5)
Alkaline Phosphatase: 134 IU/L — ABNORMAL HIGH (ref 39–117)
Bilirubin Total: 0.3 mg/dL (ref 0.0–1.2)
Bilirubin, Direct: 0.1 mg/dL (ref 0.00–0.40)
Total Protein: 6.7 g/dL (ref 6.0–8.5)

## 2018-10-09 LAB — CBC
Hematocrit: 41.7 % (ref 34.0–46.6)
Hemoglobin: 14.3 g/dL (ref 11.1–15.9)
MCH: 30.4 pg (ref 26.6–33.0)
MCHC: 34.3 g/dL (ref 31.5–35.7)
MCV: 89 fL (ref 79–97)
Platelets: 265 10*3/uL (ref 150–450)
RBC: 4.71 x10E6/uL (ref 3.77–5.28)
RDW: 12.6 % (ref 12.3–15.4)
WBC: 4.6 10*3/uL (ref 3.4–10.8)

## 2018-10-09 LAB — LIPID PANEL
Chol/HDL Ratio: 3.1 ratio (ref 0.0–4.4)
Cholesterol, Total: 166 mg/dL (ref 100–199)
HDL: 53 mg/dL (ref >39)
LDL Calculated: 89 mg/dL (ref 0–99)
Triglycerides: 121 mg/dL (ref 0–149)
VLDL Cholesterol Cal: 24 mg/dL (ref 5–40)

## 2018-10-09 LAB — TSH: TSH: 0.156 u[IU]/mL — ABNORMAL LOW (ref 0.450–4.500)

## 2019-02-03 ENCOUNTER — Other Ambulatory Visit: Payer: Self-pay | Admitting: Cardiology

## 2019-02-03 ENCOUNTER — Telehealth: Payer: Self-pay | Admitting: Cardiology

## 2019-02-03 MED ORDER — AMLODIPINE BESYLATE 5 MG PO TABS: 7.5000 mg | ORAL_TABLET | Freq: Every day | ORAL | 0 refills | Status: DC

## 2019-02-03 NOTE — Telephone Encounter (Signed)
°*  STAT* If patient is at the pharmacy, call can be transferred to refill team.   1. Which medications need to be refilled? (please list name of each medication and dose if known) AMLODIPINE 2. Which pharmacy/location (including street and city if local pharmacy) is medication to be sent to? [facility]  [address]  3. Do they need a 30 day or 90 day supply? [place]

## 2019-02-03 NOTE — Telephone Encounter (Signed)
Left message for pt to call us back about medication refill request for Metoprolol. It was not on pt's list last time she came in to see Dr. So need to verify if pt is on this medication, looks like it expired in 7/19.

## 2019-02-03 NOTE — Telephone Encounter (Signed)
°*  STAT* If patient is at the pharmacy, call can be transferred to refill team.   1. Which medications need to be refilled? (please list name of each medication and dose if known) METOPROLOL 2. Which pharmacy/location (including street and city if local pharmacy) is medication to be sent to? [facility] [address]  3. Do they need a 30 day or 90 day supply? 90DAY

## 2019-02-04 NOTE — Telephone Encounter (Signed)
Amlodipine refill sent yesterday, metoprolol refill sent today to [facility] on [address] As specified

## 2019-02-04 NOTE — Telephone Encounter (Signed)
Gloria Bass, this patient needs her metoprolol refilled also sent to the same pharmacy please.

## 2019-04-30 ENCOUNTER — Other Ambulatory Visit: Payer: Self-pay | Admitting: Cardiology

## 2019-05-02 NOTE — Telephone Encounter (Signed)
Amlodipine refill sent to [facility] in [facility]

## 2019-05-16 ENCOUNTER — Other Ambulatory Visit: Payer: Self-pay | Admitting: Obstetrics and Gynecology

## 2019-05-16 DIAGNOSIS — Z1231 Encounter for screening mammogram for malignant neoplasm of breast: Secondary | ICD-10-CM

## 2019-05-24 ENCOUNTER — Telehealth: Payer: Self-pay | Admitting: Cardiology

## 2019-05-24 NOTE — Telephone Encounter (Signed)
°  Out of medication   1. Which medications need to be refilled? (please list name of each medication and dose if known) Metoprolol succinate 50mg  24HR tablet  2. Which pharmacy/location (including street and city if local pharmacy) is medication to be sent to? [facility] at [address] [place]  3. Do they need a 30 day or 90 day supply? [place]

## 2019-05-26 ENCOUNTER — Other Ambulatory Visit: Payer: Self-pay

## 2019-05-26 MED ORDER — METOPROLOL SUCCINATE ER 50 MG PO TB24: 50.0000 mg | ORAL_TABLET | Freq: Every day | ORAL | 3 refills | Status: DC

## 2019-06-01 ENCOUNTER — Other Ambulatory Visit: Payer: Self-pay | Admitting: Cardiology

## 2019-06-09 DIAGNOSIS — L710 Perioral dermatitis: Secondary | ICD-10-CM | POA: Diagnosis not present

## 2019-06-29 ENCOUNTER — Ambulatory Visit
Admission: RE | Admit: 2019-06-29 | Discharge: 2019-06-29 | Disposition: A | Discharge status: Home / Self Care | Payer: BC Managed Care – PPO | Source: Ambulatory Visit | Attending: Obstetrics and Gynecology | Admitting: Obstetrics and Gynecology

## 2019-06-29 ENCOUNTER — Other Ambulatory Visit: Payer: Self-pay

## 2019-06-29 DIAGNOSIS — Z1231 Encounter for screening mammogram for malignant neoplasm of breast: Secondary | ICD-10-CM

## 2019-06-29 LAB — HM MAMMOGRAPHY

## 2019-07-07 ENCOUNTER — Telehealth: Payer: Self-pay | Admitting: Cardiology

## 2019-07-07 NOTE — Telephone Encounter (Signed)
Patient is requesting refill and not sure why this is being denied. I told her she may need to be seen for f/up. Please advise. Her phone number is [phone]

## 2019-07-08 ENCOUNTER — Other Ambulatory Visit: Payer: Self-pay

## 2019-07-08 MED ORDER — METOPROLOL SUCCINATE ER 50 MG PO TB24: 50.0000 mg | ORAL_TABLET | Freq: Every day | ORAL | 0 refills | Status: DC

## 2019-07-08 MED ORDER — AMLODIPINE BESYLATE 5 MG PO TABS: 7.5000 mg | ORAL_TABLET | Freq: Every day | ORAL | 0 refills | Status: DC

## 2019-07-08 NOTE — Telephone Encounter (Signed)
Patient is asking for a call back and is out of medication. Please advise

## 2019-08-31 ENCOUNTER — Ambulatory Visit: Payer: Managed Care, Other (non HMO) | Admitting: Cardiology

## 2019-10-26 ENCOUNTER — Ambulatory Visit (INDEPENDENT_AMBULATORY_CARE_PROVIDER_SITE_OTHER): Payer: BC Managed Care – PPO | Admitting: Cardiology

## 2019-10-26 ENCOUNTER — Encounter: Payer: Self-pay | Admitting: Cardiology

## 2019-10-26 ENCOUNTER — Other Ambulatory Visit: Payer: Self-pay

## 2019-10-26 VITALS — BP 150/82 | HR 63 | Ht 62.5 in | Wt 145.0 lb

## 2019-10-26 DIAGNOSIS — Z1329 Encounter for screening for other suspected endocrine disorder: Secondary | ICD-10-CM

## 2019-10-26 DIAGNOSIS — I10 Essential (primary) hypertension: Secondary | ICD-10-CM

## 2019-10-26 MED ORDER — AMLODIPINE BESYLATE 10 MG PO TABS: 10.0000 mg | ORAL_TABLET | Freq: Every day | ORAL | 1 refills | Status: DC

## 2019-10-26 NOTE — Patient Instructions (Signed)
Medication Instructions:  Your physician has recommended you make the following change in your medication:   INCREASE amlodipine from 7.5 to 10 mg once daily  *If you need a refill on your cardiac medications before your next appointment, please call your pharmacy*  Lab Work: Your physician recommends that you have a BMP, CBC, TSH, hepatic and lipid done today   If you have labs (blood work) drawn today and your tests are completely normal, you will receive your results only by: Marland Kitchen MyChart Message (if you have MyChart) OR . A paper copy in the mail If you have any lab test that is abnormal or we need to change your treatment, we will call you to review the results.  Testing/Procedures: You had an EKG performed today  Follow-Up: At [facility], you and your health needs are our priority.  As part of our continuing mission to provide you with exceptional heart care, we have created designated Provider Care Teams.  These Care Teams include your primary Cardiologist (physician) and Advanced Practice Providers (APPs -  Physician Assistants and Nurse Practitioners) who all work together to provide you with the care you need, when you need it.  Your next appointment:   6 week(s)  The format for your next appointment:   In Person  Provider:   Jyl Heinz, MD

## 2019-10-26 NOTE — Progress Notes (Signed)
Cardiology Office Note:    Date:  10/26/2019   ID:  Gloria Bass, DOB [birth year], MRN [id number]  PCP:  Maylon Peppers, MD  Cardiologist:  Jenean Lindau, MD   Referring MD: Maylon Peppers, MD    ASSESSMENT:    1. Essential hypertension    PLAN:    In order of problems listed above:  1. Essential hypertension: Patient seems to be following dietary measures well and her blood pressure still elevated so increase amlodipine to 10 mg daily.  She keep a track of her blood pressures twice a day.  She will also exercise regularly and dietary restrictions and advice was given extensively. 2. Dyslipidemia: We will get her blood checked on today she is fasting.  We will also do other evaluation such as chemistries and TSH. 3. Patient will be seen in follow-up appointment in 6 weeks or earlier if the patient has any concerns    Medication Adjustments/Labs and Tests Ordered: Current medicines are reviewed at length with the patient today.  Concerns regarding medicines are outlined above.  No orders of the defined types were placed in this encounter.  No orders of the defined types were placed in this encounter.    Chief Complaint  Patient presents with  . Follow-up    6 Months     History of Present Illness:    Gloria Bass is a 53 y.o. female.  Patient has past medical history of essential hypertension.  She denies any problems at this time and takes care of activities of daily living.  No chest pain orthopnea or PND.  At the time of my evaluation, the patient is alert awake oriented and in no distress.  She mentions to me that her blood pressure is running high for the past several weeks.  She is trying to do well with diet and exercise.  Past Medical History:  Diagnosis Date  . Allergic rhinitis   . Cervical stenosis (uterine cervix)   . History of pituitary tumor    prolactinoma  . Hypertension   . Migraines   . PMB (postmenopausal bleeding)     Past Surgical History:    Procedure Laterality Date  . COLONOSCOPY WITH PROPOFOL  2018  . DOBUTAMINE STRESS ECHO  11-30-2017   dr Geraldo Pitter   normal withou evidence ischemia/  normal LVEF  . HYSTEROSCOPY WITH D & C N/A 06/03/2018   Procedure: DILATATION AND CURETTAGE /HYSTEROSCOPY;  Surgeon: Arvella Nigh, MD;  Location: [facility];  Service: Gynecology;  Laterality: N/A;  . TRANSPHENOIDAL PITUITARY RESECTION  age 80  @ [facility]   benign    Current Medications: Current Meds  Medication Sig  . amLODipine (NORVASC) 5 MG tablet Take 1.5 tablets (7.5 mg total) by mouth daily.  . metoprolol succinate (TOPROL-XL) 50 MG 24 hr tablet Take 1 tablet (50 mg total) by mouth daily. Take with or immediately following a meal.     Allergies:   Lisinopril   Social History   Socioeconomic History  . Marital status: Legally Separated    Spouse name: Not on file  . Number of children: Not on file  . Years of education: Not on file  . Highest education level: Not on file  Occupational History  . Not on file  Tobacco Use  . Smoking status: Never Smoker  . Smokeless tobacco: Never Used  Substance and Sexual Activity  . Alcohol use: Yes    Comment: occasional  . Drug use: No  .  Sexual activity: Not on file  Other Topics Concern  . Not on file  Social History Narrative  . Not on file   Social Determinants of Health   Financial Resource Strain:   . Difficulty of Paying Living Expenses: Not on file  Food Insecurity:   . Worried About Charity fundraiser in the Last Year: Not on file  . Ran Out of Food in the Last Year: Not on file  Transportation Needs:   . Lack of Transportation (Medical): Not on file  . Lack of Transportation (Non-Medical): Not on file  Physical Activity:   . Days of Exercise per Week: Not on file  . Minutes of Exercise per Session: Not on file  Stress:   . Feeling of Stress : Not on file  Social Connections:   . Frequency of Communication with Friends and Family: Not on file  .  Frequency of Social Gatherings with Friends and Family: Not on file  . Attends Religious Services: Not on file  . Active Member of Clubs or Organizations: Not on file  . Attends Archivist Meetings: Not on file  . Marital Status: Not on file     Family History: The patient's family history includes Hypertension in her father and mother.  ROS:   Please see the history of present illness.    All other systems reviewed and are negative.  EKGs/Labs/Other Studies Reviewed:    The following studies were reviewed today: I discussed my findings with the patient at length   Recent Labs: No results found for requested labs within last 8760 hours.  Recent Lipid Panel    Component Value Date/Time   CHOL 166 10/08/2018 0943   TRIG 121 10/08/2018 0943   HDL 53 10/08/2018 0943   CHOLHDL 3.1 10/08/2018 0943   LDLCALC 89 10/08/2018 0943    Physical Exam:    VS:  BP (!) 150/82 (BP Location: Right Arm, Patient Position: Sitting, Cuff Size: Normal)   Pulse 63   Ht 5' 2.5" (1.588 m)   Wt 145 lb (65.8 kg)   SpO2 98%   BMI 26.10 kg/m     Wt Readings from Last 3 Encounters:  10/26/19 145 lb (65.8 kg)  10/08/18 146 lb (66.2 kg)  06/03/18 147 lb (66.7 kg)     GEN: Patient is in no acute distress HEENT: Normal NECK: No JVD; No carotid bruits LYMPHATICS: No lymphadenopathy CARDIAC: Hear sounds regular, 2/6 systolic murmur at the apex. RESPIRATORY:  Clear to auscultation without rales, wheezing or rhonchi  ABDOMEN: Soft, non-tender, non-distended MUSCULOSKELETAL:  No edema; No deformity  SKIN: Warm and dry NEUROLOGIC:  Alert and oriented x 3 PSYCHIATRIC:  Normal affect   Signed, Jenean Lindau, MD  10/26/2019 10:27 AM    [place]

## 2019-10-27 LAB — LIPID PANEL
Chol/HDL Ratio: 3.1 ratio (ref 0.0–4.4)
Cholesterol, Total: 176 mg/dL (ref 100–199)
HDL: 57 mg/dL (ref >39)
LDL Chol Calc (NIH): 93 mg/dL (ref 0–99)
Triglycerides: 151 mg/dL — ABNORMAL HIGH (ref 0–149)
VLDL Cholesterol Cal: 26 mg/dL (ref 5–40)

## 2019-10-27 LAB — TSH: TSH: 0.161 u[IU]/mL — ABNORMAL LOW (ref 0.450–4.500)

## 2019-10-27 LAB — BASIC METABOLIC PANEL
BUN/Creatinine Ratio: 13 (ref 9–23)
BUN: 10 mg/dL (ref 6–24)
CO2: 27 mmol/L (ref 20–29)
Calcium: 10 mg/dL (ref 8.7–10.2)
Chloride: 105 mmol/L (ref 96–106)
Creatinine, Ser: 0.75 mg/dL (ref 0.57–1.00)
GFR calc Af Amer: 105 mL/min/{1.73_m2} (ref >59)
GFR calc non Af Amer: 91 mL/min/{1.73_m2} (ref >59)
Glucose: 90 mg/dL (ref 65–99)
Potassium: 4.8 mmol/L (ref 3.5–5.2)
Sodium: 144 mmol/L (ref 134–144)

## 2019-10-27 LAB — CBC
Hematocrit: 42.1 % (ref 34.0–46.6)
Hemoglobin: 14.3 g/dL (ref 11.1–15.9)
MCH: 31.2 pg (ref 26.6–33.0)
MCHC: 34 g/dL (ref 31.5–35.7)
MCV: 92 fL (ref 79–97)
Platelets: 260 10*3/uL (ref 150–450)
RBC: 4.58 x10E6/uL (ref 3.77–5.28)
RDW: 12.9 % (ref 11.7–15.4)
WBC: 4.5 10*3/uL (ref 3.4–10.8)

## 2019-10-27 LAB — HEPATIC FUNCTION PANEL
ALT: 22 IU/L (ref 0–32)
AST: 29 IU/L (ref 0–40)
Albumin: 4.7 g/dL (ref 3.8–4.9)
Alkaline Phosphatase: 128 IU/L — ABNORMAL HIGH (ref 39–117)
Bilirubin Total: 0.3 mg/dL (ref 0.0–1.2)
Bilirubin, Direct: 0.09 mg/dL (ref 0.00–0.40)
Total Protein: 7.1 g/dL (ref 6.0–8.5)

## 2019-12-15 ENCOUNTER — Ambulatory Visit (INDEPENDENT_AMBULATORY_CARE_PROVIDER_SITE_OTHER): Payer: BC Managed Care – PPO | Admitting: Cardiology

## 2019-12-15 ENCOUNTER — Other Ambulatory Visit: Payer: Self-pay

## 2019-12-15 ENCOUNTER — Encounter: Payer: Self-pay | Admitting: Cardiology

## 2019-12-15 VITALS — BP 128/84 | HR 70 | Temp 98.4°F | Ht 62.5 in | Wt 148.1 lb

## 2019-12-15 DIAGNOSIS — I10 Essential (primary) hypertension: Secondary | ICD-10-CM | POA: Diagnosis not present

## 2019-12-15 DIAGNOSIS — R079 Chest pain, unspecified: Secondary | ICD-10-CM | POA: Diagnosis not present

## 2019-12-15 MED ORDER — HYDROCHLOROTHIAZIDE 25 MG PO TABS: 12.5000 mg | ORAL_TABLET | Freq: Every day | ORAL | 12 refills | Status: DC

## 2019-12-15 NOTE — Patient Instructions (Signed)
Medication Instructions:  Your physician has recommended you make the following change in your medication:   Start Hydrochlorothiazide 25 mg take 1/2 of a tablet daily.  *If you need a refill on your cardiac medications before your next appointment, please call your pharmacy*  Lab Work: None ordered If you have labs (blood work) drawn today and your tests are completely normal, you will receive your results only by: Marland Kitchen MyChart Message (if you have MyChart) OR . A paper copy in the mail If you have any lab test that is abnormal or we need to change your treatment, we will call you to review the results.  Testing/Procedures: None ordered  Follow-Up: At [facility], you and your health needs are our priority.  As part of our continuing mission to provide you with exceptional heart care, we have created designated Provider Care Teams.  These Care Teams include your primary Cardiologist (physician) and Advanced Practice Providers (APPs -  Physician Assistants and Nurse Practitioners) who all work together to provide you with the care you need, when you need it.  Your next appointment:   1 month(s)  The format for your next appointment:   In Person  Provider:   Jyl Heinz, MD  Other Instructions NA

## 2019-12-15 NOTE — Progress Notes (Signed)
Cardiology Office Note:    Date:  12/15/2019   ID:  Gloria Bass, DOB [birth year], MRN [id number]  PCP:  Maylon Peppers, MD  Cardiologist:  Jenean Lindau, MD   Referring MD: Maylon Peppers, MD    ASSESSMENT:    1. Essential hypertension   2. Chest pain, unspecified type    PLAN:    In order of problems listed above:  1. Primary prevention stressed with the patient.  Importance of compliance with diet and medication stressed and she vocalized understanding.  Importance of regular exercise stressed. 2. Essential hypertension: Blood pressure is stable and mildly elevated.  I have told her to start hydrochlorothiazide 25 mg half tablet daily.  Potassium supplementation by diet was encouraged and she will have a Chem-7 in a month when she sees me in follow-up. 3. Mixed dyslipidemia: Diet was discussed her lipids are fine and need to be a little better she understands that 4. Hypothyroidism: Managed by primary care physician and I discussed numbers with her.   Medication Adjustments/Labs and Tests Ordered: Current medicines are reviewed at length with the patient today.  Concerns regarding medicines are outlined above.  No orders of the defined types were placed in this encounter.  No orders of the defined types were placed in this encounter.    No chief complaint on file.    History of Present Illness:    Gloria Bass is a 54 y.o. female.  Patient has past medical history of essential hypertension and mild dyslipidemia.  She denies any problems at this time and takes care of activities of daily living.  No chest pain orthopnea or PND.  She has history of hypothyroidism and managed by primary care physician.  At the time of my evaluation, the patient is alert awake oriented and in no distress.  Past Medical History:  Diagnosis Date  . Allergic rhinitis   . Cervical stenosis (uterine cervix)   . History of pituitary tumor    prolactinoma  . Hypertension   . Migraines   . PMB  (postmenopausal bleeding)     Past Surgical History:  Procedure Laterality Date  . COLONOSCOPY WITH PROPOFOL  2018  . DOBUTAMINE STRESS ECHO  11-30-2017   dr Geraldo Pitter   normal withou evidence ischemia/  normal LVEF  . HYSTEROSCOPY WITH D & C N/A 06/03/2018   Procedure: DILATATION AND CURETTAGE /HYSTEROSCOPY;  Surgeon: Arvella Nigh, MD;  Location: [facility];  Service: Gynecology;  Laterality: N/A;  . TRANSPHENOIDAL PITUITARY RESECTION  age 39  @ [facility]   benign    Current Medications: Current Meds  Medication Sig  . amLODipine (NORVASC) 10 MG tablet Take 1 tablet (10 mg total) by mouth daily.  Marland Kitchen ethacrynic acid (EDECRIN) 25 MG tablet TK 1 TO 2 TS PO PRF SWELLING--- as needed     Allergies:   Lisinopril   Social History   Socioeconomic History  . Marital status: Legally Separated    Spouse name: Not on file  . Number of children: Not on file  . Years of education: Not on file  . Highest education level: Not on file  Occupational History  . Not on file  Tobacco Use  . Smoking status: Never Smoker  . Smokeless tobacco: Never Used  Substance and Sexual Activity  . Alcohol use: Yes    Comment: occasional  . Drug use: No  . Sexual activity: Not on file  Other Topics Concern  . Not on file  Social  History Narrative  . Not on file   Social Determinants of Health   Financial Resource Strain:   . Difficulty of Paying Living Expenses: Not on file  Food Insecurity:   . Worried About Charity fundraiser in the Last Year: Not on file  . Ran Out of Food in the Last Year: Not on file  Transportation Needs:   . Lack of Transportation (Medical): Not on file  . Lack of Transportation (Non-Medical): Not on file  Physical Activity:   . Days of Exercise per Week: Not on file  . Minutes of Exercise per Session: Not on file  Stress:   . Feeling of Stress : Not on file  Social Connections:   . Frequency of Communication with Friends and Family: Not on file  .  Frequency of Social Gatherings with Friends and Family: Not on file  . Attends Religious Services: Not on file  . Active Member of Clubs or Organizations: Not on file  . Attends Archivist Meetings: Not on file  . Marital Status: Not on file     Family History: The patient's family history includes Hypertension in her father and mother.  ROS:   Please see the history of present illness.    All other systems reviewed and are negative.  EKGs/Labs/Other Studies Reviewed:    The following studies were reviewed today: I discussed the findings of blood work including lipids and thyroid test with her at length   Recent Labs: 10/26/2019: ALT 22; BUN 10; Creatinine, Ser 0.75; Hemoglobin 14.3; Platelets 260; Potassium 4.8; Sodium 144; TSH 0.161  Recent Lipid Panel    Component Value Date/Time   CHOL 176 10/26/2019 1058   TRIG 151 (H) 10/26/2019 1058   HDL 57 10/26/2019 1058   CHOLHDL 3.1 10/26/2019 1058   LDLCALC 93 10/26/2019 1058    Physical Exam:    VS:  BP 128/84   Pulse 70   Temp 98.4 F (36.9 C)   Ht 5' 2.5" (1.588 m)   Wt 148 lb 1.9 oz (67.2 kg)   SpO2 96%   BMI 26.66 kg/m     Wt Readings from Last 3 Encounters:  12/15/19 148 lb 1.9 oz (67.2 kg)  10/26/19 145 lb (65.8 kg)  10/08/18 146 lb (66.2 kg)     GEN: Patient is in no acute distress HEENT: Normal NECK: No JVD; No carotid bruits LYMPHATICS: No lymphadenopathy CARDIAC: Hear sounds regular, 2/6 systolic murmur at the apex. RESPIRATORY:  Clear to auscultation without rales, wheezing or rhonchi  ABDOMEN: Soft, non-tender, non-distended MUSCULOSKELETAL:  No edema; No deformity  SKIN: Warm and dry NEUROLOGIC:  Alert and oriented x 3 PSYCHIATRIC:  Normal affect   Signed, Jenean Lindau, MD  12/15/2019 11:09 AM    [place]

## 2019-12-20 ENCOUNTER — Ambulatory Visit: Payer: Managed Care, Other (non HMO) | Admitting: Family

## 2020-01-06 ENCOUNTER — Other Ambulatory Visit: Payer: Self-pay

## 2020-01-06 ENCOUNTER — Encounter: Payer: Self-pay | Admitting: Family

## 2020-01-06 ENCOUNTER — Ambulatory Visit: Payer: BC Managed Care – PPO | Admitting: Family

## 2020-01-06 DIAGNOSIS — I10 Essential (primary) hypertension: Secondary | ICD-10-CM

## 2020-01-06 DIAGNOSIS — E010 Iodine-deficiency related diffuse (endemic) goiter: Secondary | ICD-10-CM

## 2020-01-06 DIAGNOSIS — G43909 Migraine, unspecified, not intractable, without status migrainosus: Secondary | ICD-10-CM | POA: Diagnosis not present

## 2020-01-06 DIAGNOSIS — E0590 Thyrotoxicosis, unspecified without thyrotoxic crisis or storm: Secondary | ICD-10-CM

## 2020-01-06 DIAGNOSIS — N950 Postmenopausal bleeding: Secondary | ICD-10-CM

## 2020-01-06 DIAGNOSIS — Z86018 Personal history of other benign neoplasm: Secondary | ICD-10-CM | POA: Diagnosis not present

## 2020-01-06 NOTE — Patient Instructions (Addendum)
Please complete lab work prior to leaving. You should be contacted about scheduling your thyroid ultrasound.

## 2020-01-06 NOTE — Progress Notes (Signed)
Subjective:    Patient ID: Gloria Bass, female    DOB: 1966-04-10, 54 y.o.   MRN: [id number]  HPI  Hyperthyroid-  Does have heat intolerance. Denies weight loss, nervousness, diarrhea. Does report some palpitations.  This has been going on for years.   HTN- Maintained on amlodipine, hctz and metoprolol.   BP Readings from Last 3 Encounters:  12/15/19 128/84  10/26/19 (!) 150/82  10/08/18 126/72   PMB- reports was told that she had endometrial polyps, no further bleeding.  Had D and C 8/19.   She saw Dr. Radene Knee.  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  Reports no milk/nipple discharge.  Migraines- since she was a child.  No migraine in 1 year. Reports that after she got braces her migraines stopped.   Post-menopausal bleeding- reports that this has been evaluated by Dr. Radene Knee including a D and C which was normal. (noted endometrial polyp and benign endometrial tissue).  Past Medical History:  Diagnosis Date  . Allergic rhinitis   . Cervical stenosis (uterine cervix)   . History of pituitary tumor    prolactinoma  . Hypertension   . Migraines   . PMB (postmenopausal bleeding)      Social History   Socioeconomic History  . Marital status: Legally Separated    Spouse name: Not on file  . Number of children: Not on file  . Years of education: Not on file  . Highest education level: Not on file  Occupational History  . Not on file  Tobacco Use  . Smoking status: Current Every Day Smoker    Types: Cigarettes  . Smokeless tobacco: Never Used  Substance and Sexual Activity  . Alcohol use: Yes    Comment: occasional  . Drug use: No  . Sexual activity: Not Currently    Birth control/protection: Post-menopausal  Other Topics Concern  . Not on file  Social History Narrative  . Not on file   Social Determinants of Health   Financial Resource Strain:   . Difficulty of Paying Living Expenses: Not on file  Food  Insecurity:   . Worried About Charity fundraiser in the Last Year: Not on file  . Ran Out of Food in the Last Year: Not on file  Transportation Needs:   . Lack of Transportation (Medical): Not on file  . Lack of Transportation (Non-Medical): Not on file  Physical Activity:   . Days of Exercise per Week: Not on file  . Minutes of Exercise per Session: Not on file  Stress:   . Feeling of Stress : Not on file  Social Connections:   . Frequency of Communication with Friends and Family: Not on file  . Frequency of Social Gatherings with Friends and Family: Not on file  . Attends Religious Services: Not on file  . Active Member of Clubs or Organizations: Not on file  . Attends Archivist Meetings: Not on file  . Marital Status: Not on file  Intimate Partner Violence:   . Fear of Current or Ex-Partner: Not on file  . Emotionally Abused: Not on file  . Physically Abused: Not on file  . Sexually Abused: Not on file    Past Surgical History:  Procedure Laterality Date  . COLONOSCOPY WITH PROPOFOL  2018  . DOBUTAMINE STRESS ECHO  11-30-2017   dr Geraldo Pitter   normal withou evidence ischemia/  normal LVEF  . HYSTEROSCOPY WITH D & C N/A 06/03/2018  Procedure: DILATATION AND CURETTAGE /HYSTEROSCOPY;  Surgeon: Arvella Nigh, MD;  Location: [facility];  Service: Gynecology;  Laterality: N/A;  . TRANSPHENOIDAL PITUITARY RESECTION  age 26  @ [facility]   benign    Family History  Problem Relation Age of Onset  . Hypertension Mother   . Hypertension Father     Allergies  Allergen Reactions  . Lisinopril Swelling    Angioedema     Current Outpatient Medications on File Prior to Visit  Medication Sig Dispense Refill  . amLODipine (NORVASC) 10 MG tablet Take 1 tablet (10 mg total) by mouth daily. 90 tablet 1  . hydrochlorothiazide (HYDRODIURIL) 25 MG tablet Take 0.5 tablets (12.5 mg total) by mouth daily. 15 tablet 12  . metoprolol succinate (TOPROL-XL) 50 MG 24 hr  tablet Take 1 tablet (50 mg total) by mouth daily. Take with or immediately following a meal. 90 tablet 0  . SUMAtriptan (IMITREX) 100 MG tablet Take 50-100 mg by mouth every 2 (two) hours as needed.      No current facility-administered medications on file prior to visit.    There were no vitals taken for this visit.    Review of Systems See HPI  Past Medical History:  Diagnosis Date  . Allergic rhinitis   . Cervical stenosis (uterine cervix)   . History of pituitary tumor    prolactinoma  . Hypertension   . Migraines   . PMB (postmenopausal bleeding)   . Subclinical hypothyroidism 05/16/2008   Qualifier: Diagnosis of  By: Tilden Dome       Social History   Socioeconomic History  . Marital status: Legally Separated    Spouse name: Not on file  . Number of children: Not on file  . Years of education: Not on file  . Highest education level: Not on file  Occupational History  . Not on file  Tobacco Use  . Smoking status: Current Every Day Smoker    Types: Cigarettes  . Smokeless tobacco: Never Used  Substance and Sexual Activity  . Alcohol use: Yes    Comment: occasional  . Drug use: No  . Sexual activity: Not Currently    Birth control/protection: Post-menopausal  Other Topics Concern  . Not on file  Social History Narrative   Works as a Government social research officer for [employer]   Single   Has son (adult)-  Lives in [place]   Enjoys making jewelry   Spending time with her niece/nephews   No pets   Social Determinants of Radio broadcast assistant Strain:   . Difficulty of Paying Living Expenses: Not on file  Food Insecurity:   . Worried About Charity fundraiser in the Last Year: Not on file  . Ran Out of Food in the Last Year: Not on file  Transportation Needs:   . Lack of Transportation (Medical): Not on file  . Lack of Transportation (Non-Medical): Not on file  Physical Activity:   . Days of Exercise per Week: Not on file  .  Minutes of Exercise per Session: Not on file  Stress:   . Feeling of Stress : Not on file  Social Connections:   . Frequency of Communication with Friends and Family: Not on file  . Frequency of Social Gatherings with Friends and Family: Not on file  . Attends Religious Services: Not on file  . Active Member of Clubs or Organizations: Not on file  . Attends Archivist  Meetings: Not on file  . Marital Status: Not on file  Intimate Partner Violence:   . Fear of Current or Ex-Partner: Not on file  . Emotionally Abused: Not on file  . Physically Abused: Not on file  . Sexually Abused: Not on file    Past Surgical History:  Procedure Laterality Date  . COLONOSCOPY WITH PROPOFOL  2018  . DOBUTAMINE STRESS ECHO  11-30-2017   dr Geraldo Pitter   normal withou evidence ischemia/  normal LVEF  . HYSTEROSCOPY WITH D & C N/A 06/03/2018   Procedure: DILATATION AND CURETTAGE /HYSTEROSCOPY;  Surgeon: Arvella Nigh, MD;  Location: [facility];  Service: Gynecology;  Laterality: N/A;  . TRANSPHENOIDAL PITUITARY RESECTION  age 33  @ [facility]   benign    Family History  Problem Relation Age of Onset  . Hypertension Mother   . Hypertension Father     Allergies  Allergen Reactions  . Lisinopril Swelling    Angioedema     Current Outpatient Medications on File Prior to Visit  Medication Sig Dispense Refill  . amLODipine (NORVASC) 10 MG tablet Take 1 tablet (10 mg total) by mouth daily. 90 tablet 1  . hydrochlorothiazide (HYDRODIURIL) 25 MG tablet Take 0.5 tablets (12.5 mg total) by mouth daily. 15 tablet 12  . metoprolol succinate (TOPROL-XL) 50 MG 24 hr tablet Take 1 tablet (50 mg total) by mouth daily. Take with or immediately following a meal. 90 tablet 0  . SUMAtriptan (IMITREX) 100 MG tablet Take 50-100 mg by mouth every 2 (two) hours as needed.      No current facility-administered medications on file prior to visit.    There were no vitals taken for this visit.       Objective:   Physical Exam Constitutional:      Appearance: She is well-developed.  HENT:     Right Ear: Tympanic membrane and ear canal normal.     Left Ear: Tympanic membrane and ear canal normal.  Neck:     Thyroid: Thyromegaly: right sided thyroid fullness noted.  Cardiovascular:     Rate and Rhythm: Normal rate and regular rhythm.     Heart sounds: Normal heart sounds. No murmur.  Pulmonary:     Effort: Pulmonary effort is normal. No respiratory distress.     Breath sounds: Normal breath sounds. No wheezing.  Musculoskeletal:     Cervical back: Neck supple.  Skin:    General: Skin is warm and dry.  Neurological:     Mental Status: She is alert and oriented to person, place, and time.  Psychiatric:        Behavior: Behavior normal.        Thought Content: Thought content normal.        Judgment: Judgment normal.           Assessment & Plan:  Subclinical hyperthyroidism/thyromegaly- Depressed TSH, normal free t3 and free t4.  Will obtain thyroid US to further evaluate thyroid asymmetry noted on today's exam. Plan to continue to monitor TFT's.  If fT3/fT4 become elevated plan referral to endocrinology.  HTN- bp stable on current mediations.  Electrolytes and renal function are normal.  Continue current meds.  Hx of Prolactinoma- s/p transphenoidal pituitary resection.   Follow up prolactin level is performed and is WNL.  Migraines- currently stable. Will monitor.   Post-menopausal bleeding- resolved.  Work up negative with GYN.  This visit occurred during the SARS-CoV-2 public health emergency.  Safety protocols were in place,  including screening questions prior to the visit, additional usage of staff PPE, and extensive cleaning of exam room while observing appropriate contact time as indicated for disinfecting solutions.

## 2020-01-07 LAB — PROLACTIN: Prolactin: 5.3 ng/mL

## 2020-01-07 LAB — TSH: TSH: 0.22 mIU/L — ABNORMAL LOW

## 2020-01-07 LAB — T4, FREE: Free T4: 1 ng/dL (ref 0.8–1.8)

## 2020-01-07 LAB — T3, FREE: T3, Free: 3.3 pg/mL (ref 2.3–4.2)

## 2020-01-09 ENCOUNTER — Encounter: Payer: Self-pay | Admitting: Family

## 2020-01-10 ENCOUNTER — Telehealth: Payer: Self-pay | Admitting: Family

## 2020-01-10 NOTE — Telephone Encounter (Signed)
Patient is scheduled to have COVID tomorrow, Patient is also schedule for ultrasound, Patient would like to make sure she will be fine doing both hours apart .  Please advise

## 2020-01-10 NOTE — Telephone Encounter (Signed)
Patient advised, per Melissa ok to have thyroid US 3 hours after covid19 immunization shot.

## 2020-01-11 ENCOUNTER — Other Ambulatory Visit: Payer: Self-pay

## 2020-01-11 ENCOUNTER — Ambulatory Visit (HOSPITAL_BASED_OUTPATIENT_CLINIC_OR_DEPARTMENT_OTHER)
Admission: RE | Admit: 2020-01-11 | Discharge: 2020-01-11 | Disposition: A | Discharge status: Home / Self Care | Payer: BC Managed Care – PPO | Source: Ambulatory Visit | Attending: Family | Admitting: Family

## 2020-01-11 DIAGNOSIS — E049 Nontoxic goiter, unspecified: Secondary | ICD-10-CM | POA: Diagnosis not present

## 2020-01-11 DIAGNOSIS — E010 Iodine-deficiency related diffuse (endemic) goiter: Secondary | ICD-10-CM | POA: Insufficient documentation

## 2020-01-12 ENCOUNTER — Ambulatory Visit: Payer: BC Managed Care – PPO | Admitting: Cardiology

## 2020-01-12 ENCOUNTER — Telehealth: Payer: Self-pay | Admitting: Family

## 2020-01-12 DIAGNOSIS — E041 Nontoxic single thyroid nodule: Secondary | ICD-10-CM

## 2020-01-12 DIAGNOSIS — Z87898 Personal history of other specified conditions: Secondary | ICD-10-CM

## 2020-01-12 NOTE — Telephone Encounter (Signed)
Left message on answering machine requesting that pt check her mychart account.

## 2020-01-13 ENCOUNTER — Telehealth: Payer: Self-pay | Admitting: Family

## 2020-01-13 NOTE — Telephone Encounter (Signed)
Please contact pt re: attached mychart message. It looks like she has not read it.

## 2020-01-13 NOTE — Telephone Encounter (Signed)
Opened in error

## 2020-01-16 NOTE — Telephone Encounter (Signed)
Patient reviewed message and has additional questions. She is scheduled for needle bx tomorrow. She will call back because she has a meeting call at work, she will call back later.

## 2020-01-17 ENCOUNTER — Other Ambulatory Visit (HOSPITAL_COMMUNITY)
Admission: RE | Admit: 2020-01-17 | Discharge: 2020-01-17 | Disposition: A | Discharge status: Home / Self Care | Payer: BC Managed Care – PPO | Source: Ambulatory Visit | Attending: Family | Admitting: Family

## 2020-01-17 ENCOUNTER — Ambulatory Visit
Admission: RE | Admit: 2020-01-17 | Discharge: 2020-01-17 | Disposition: A | Discharge status: Home / Self Care | Payer: BC Managed Care – PPO | Source: Ambulatory Visit | Attending: Family | Admitting: Family

## 2020-01-17 DIAGNOSIS — E041 Nontoxic single thyroid nodule: Secondary | ICD-10-CM | POA: Insufficient documentation

## 2020-01-18 ENCOUNTER — Telehealth: Payer: Self-pay | Admitting: Family

## 2020-01-18 LAB — CYTOLOGY - NON PAP

## 2020-01-18 NOTE — Telephone Encounter (Signed)
Thanks, will make sure  patient gets message

## 2020-01-18 NOTE — Telephone Encounter (Signed)
Left detailed message for patient.Authorization has been moved to [place], [place]. Appt scheduled 01/24/20 2:00.

## 2020-01-18 NOTE — Telephone Encounter (Signed)
Please advise pt that her thyroid biopsy is benign (non-cancerous). Good news.  I would still like her to see endocrinology.  If they have not called her she can call them. I referred her to [facility].

## 2020-01-18 NOTE — Telephone Encounter (Signed)
Patient advised of results and phone number to endocrinology provided for patient to contact them for appointment.  Patient asked for location of ct to be changed to [facility] due to insurance and coverage, Diane from [facility] office will try to get location change with [employer].

## 2020-01-19 NOTE — Telephone Encounter (Signed)
Patient advised of new location, she "will confirm with insurance before scheduling"

## 2020-01-20 ENCOUNTER — Ambulatory Visit: Payer: BC Managed Care – PPO | Admitting: Cardiology

## 2020-01-24 DIAGNOSIS — E041 Nontoxic single thyroid nodule: Secondary | ICD-10-CM | POA: Diagnosis not present

## 2020-02-03 ENCOUNTER — Telehealth: Payer: Self-pay | Admitting: Family

## 2020-02-03 DIAGNOSIS — E049 Nontoxic goiter, unspecified: Secondary | ICD-10-CM

## 2020-02-03 NOTE — Telephone Encounter (Signed)
See mychart.  

## 2020-02-14 ENCOUNTER — Telehealth: Payer: Self-pay | Admitting: Family

## 2020-02-14 NOTE — Telephone Encounter (Signed)
Reviewed CT chest results from 01/24/20 with pt. She understands that it is important that she keep her upcoming appointment with Dr. Cruzita Lederer.

## 2020-02-16 ENCOUNTER — Ambulatory Visit: Payer: BC Managed Care – PPO | Admitting: Cardiology

## 2020-02-21 ENCOUNTER — Ambulatory Visit (INDEPENDENT_AMBULATORY_CARE_PROVIDER_SITE_OTHER): Payer: BC Managed Care – PPO | Admitting: Family

## 2020-02-21 ENCOUNTER — Encounter: Payer: Self-pay | Admitting: Family

## 2020-02-21 ENCOUNTER — Encounter: Payer: BC Managed Care – PPO | Admitting: Family

## 2020-02-21 ENCOUNTER — Other Ambulatory Visit: Payer: Self-pay

## 2020-02-21 VITALS — BP 132/71 | HR 64 | Temp 97.4°F | Resp 16 | Ht 62.5 in | Wt 149.0 lb

## 2020-02-21 DIAGNOSIS — Z0000 Encounter for general adult medical examination without abnormal findings: Secondary | ICD-10-CM | POA: Diagnosis not present

## 2020-02-21 DIAGNOSIS — I10 Essential (primary) hypertension: Secondary | ICD-10-CM

## 2020-02-21 DIAGNOSIS — E559 Vitamin D deficiency, unspecified: Secondary | ICD-10-CM

## 2020-02-21 LAB — BASIC METABOLIC PANEL
BUN: 11 mg/dL (ref 6–23)
CO2: 29 mEq/L (ref 19–32)
Calcium: 9.3 mg/dL (ref 8.4–10.5)
Chloride: 107 mEq/L (ref 96–112)
Creatinine, Ser: 0.67 mg/dL (ref 0.40–1.20)
GFR: 110.46 mL/min (ref >60.00)
Glucose, Bld: 94 mg/dL (ref 70–99)
Potassium: 4 mEq/L (ref 3.5–5.1)
Sodium: 142 mEq/L (ref 135–145)

## 2020-02-21 NOTE — Progress Notes (Signed)
Subjective:    Patient ID: Gloria Bass, female    DOB: 20-May-1966, 54 y.o.   MRN: [id number]  HPI  Patient is a 54 yr old female who presents today for cpx.  Immunizations: completed pfizer series, declines tetanus, declines shingrix Diet: reports that she has been eating healthy Wt Readings from Last 3 Encounters:  02/21/20 149 lb (67.6 kg)  12/15/19 148 lb 1.9 oz (67.2 kg)  10/26/19 145 lb (65.8 kg)  Exercise: walks 30-45 minutes 4 days a week  Veggie omelet Occasional smoothie during the day Salad or chicken/steamed veggies No snacks  Drinks green tea/water, coffee (cream) Colonoscopy: 05/14/17- needs f/u in 5 years.  Dexa: this was done by [facility] Pap Smear:  She will schedule Mammogram: 06/2019 Vision: 12/20 Dental:  Up to date    Review of Systems  Constitutional: Negative for unexpected weight change.  HENT: Negative for hearing loss and rhinorrhea.   Eyes: Negative for visual disturbance.  Respiratory: Negative for cough and shortness of breath.   Cardiovascular: Negative for chest pain.  Gastrointestinal: Negative for blood in stool, constipation and diarrhea.  Genitourinary: Negative for dysuria and frequency.  Musculoskeletal: Negative for arthralgias and myalgias.  Skin: Negative for rash.  Neurological: Negative for headaches.  Hematological: Negative for adenopathy.  Psychiatric/Behavioral:       Denies depression/anxiety   Past Medical History:  Diagnosis Date  . Allergic rhinitis   . Cervical stenosis (uterine cervix)   . History of pituitary tumor    prolactinoma  . Hypertension   . Migraines   . PMB (postmenopausal bleeding)   . Subclinical hypothyroidism 05/16/2008   Qualifier: Diagnosis of  By: Tilden Dome       Social History   Socioeconomic History  . Marital status: Legally Separated    Spouse name: Not on file  . Number of children: Not on file  . Years of education: Not on file  . Highest education level: Not on file    Occupational History  . Not on file  Tobacco Use  . Smoking status: Current Every Day Smoker    Types: Cigarettes  . Smokeless tobacco: Never Used  Substance and Sexual Activity  . Alcohol use: Yes    Comment: occasional  . Drug use: No  . Sexual activity: Not Currently    Birth control/protection: Post-menopausal  Other Topics Concern  . Not on file  Social History Narrative   Works as a Government social research officer for [employer]   Single   Has son (adult)-  Lives in [place]   Enjoys making jewelry   Spending time with her niece/nephews   No pets   Social Determinants of Radio broadcast assistant Strain:   . Difficulty of Paying Living Expenses:   Food Insecurity:   . Worried About Charity fundraiser in the Last Year:   . Arboriculturist in the Last Year:   Transportation Needs:   . Film/video editor (Medical):   Marland Kitchen Lack of Transportation (Non-Medical):   Physical Activity:   . Days of Exercise per Week:   . Minutes of Exercise per Session:   Stress:   . Feeling of Stress :   Social Connections:   . Frequency of Communication with Friends and Family:   . Frequency of Social Gatherings with Friends and Family:   . Attends Religious Services:   . Active Member of Clubs or Organizations:   . Attends Archivist  Meetings:   Marland Kitchen Marital Status:   Intimate Partner Violence:   . Fear of Current or Ex-Partner:   . Emotionally Abused:   Marland Kitchen Physically Abused:   . Sexually Abused:     Past Surgical History:  Procedure Laterality Date  . COLONOSCOPY WITH PROPOFOL  2018  . DOBUTAMINE STRESS ECHO  11-30-2017   dr Geraldo Pitter   normal withou evidence ischemia/  normal LVEF  . HYSTEROSCOPY WITH D & C N/A 06/03/2018   Procedure: DILATATION AND CURETTAGE /HYSTEROSCOPY;  Surgeon: Arvella Nigh, MD;  Location: [facility];  Service: Gynecology;  Laterality: N/A;  . TRANSPHENOIDAL PITUITARY RESECTION  age 14  @ [facility]   benign     Family History  Problem Relation Age of Onset  . Hypertension Mother   . Hypertension Father     Allergies  Allergen Reactions  . Lisinopril Swelling    Angioedema     Current Outpatient Medications on File Prior to Visit  Medication Sig Dispense Refill  . amLODipine (NORVASC) 10 MG tablet Take 1 tablet (10 mg total) by mouth daily. 90 tablet 1  . hydrochlorothiazide (HYDRODIURIL) 25 MG tablet Take 0.5 tablets (12.5 mg total) by mouth daily. 15 tablet 12  . metoprolol succinate (TOPROL-XL) 50 MG 24 hr tablet Take 1 tablet (50 mg total) by mouth daily. Take with or immediately following a meal. 90 tablet 0  . SUMAtriptan (IMITREX) 100 MG tablet Take 50-100 mg by mouth every 2 (two) hours as needed.      No current facility-administered medications on file prior to visit.    BP 132/71 (BP Location: Right Arm, Patient Position: Sitting, Cuff Size: Small)   Pulse 64   Temp (!) 97.4 F (36.3 C) (Temporal)   Resp 16   Ht 5' 2.5" (1.588 m)   Wt 149 lb (67.6 kg)   SpO2 100%   BMI 26.82 kg/m       Objective:   Physical Exam  Physical Exam  Constitutional: She is oriented to person, place, and time. She appears well-developed and well-nourished. No distress.  HENT:  Head: Normocephalic and atraumatic.  Right Ear: Tympanic membrane and ear canal normal.  Left Ear: Tympanic membrane and ear canal normal.  Mouth/Throat: Not examined- pt wearing a mask Eyes: Pupils are equal, round, and reactive to light. No scleral icterus.  Neck: Normal range of motion. No thyromegaly present.  Cardiovascular: Normal rate and regular rhythm.   No murmur heard. Pulmonary/Chest: Effort normal and breath sounds normal. No respiratory distress. He has no wheezes. She has no rales. She exhibits no tenderness.  Abdominal: Soft. Bowel sounds are normal. She exhibits no distension and no mass. There is no tenderness. There is no rebound and no guarding.  Musculoskeletal: She exhibits no edema.   Lymphadenopathy:    She has no cervical adenopathy.  Neurological: She is alert and oriented to person, place, and time. She has normal patellar reflexes. She exhibits normal muscle tone. Coordination normal.  Skin: Skin is warm and dry.  Psychiatric: She has a normal mood and affect. Her behavior is normal. Judgment and thought content normal.  Breasts: Examined lying Right: Without masses, retractions, discharge or axillary adenopathy.  Left: Without masses, retractions, discharge or axillary adenopathy.  Pelvic: deferred          Assessment & Plan:   Preventative care- pap due- she will schedule with GYN.  Mammo up to date.  Discussed healthy diet, exercise and weight loss.   This  visit occurred during the SARS-CoV-2 public health emergency.  Safety protocols were in place, including screening questions prior to the visit, additional usage of staff PPE, and extensive cleaning of exam room while observing appropriate contact time as indicated for disinfecting solutions.         Assessment & Plan:

## 2020-02-21 NOTE — Patient Instructions (Addendum)
Try MyFitness Pal to keep track of your caloric intake- try setting up a goal weight loss of 1 pound per week.    Preventive Care 31-55 Years Old, Female Preventive care refers to visits with your health care provider and lifestyle choices that can promote health and wellness. This includes:  A yearly physical exam. This may also be called an annual well check.  Regular dental visits and eye exams.  Immunizations.  Screening for certain conditions.  Healthy lifestyle choices, such as eating a healthy diet, getting regular exercise, not using drugs or products that contain nicotine and tobacco, and limiting alcohol use. What can I expect for my preventive care visit? Physical exam Your health care provider will check your:  Height and weight. This may be used to calculate body mass index (BMI), which tells if you are at a healthy weight.  Heart rate and blood pressure.  Skin for abnormal spots. Counseling Your health care provider may ask you questions about your:  Alcohol, tobacco, and drug use.  Emotional well-being.  Home and relationship well-being.  Sexual activity.  Eating habits.  Work and work Statistician.  Method of birth control.  Menstrual cycle.  Pregnancy history. What immunizations do I need?  Influenza (flu) vaccine  This is recommended every year. Tetanus, diphtheria, and pertussis (Tdap) vaccine  You may need a Td booster every 10 years. Varicella (chickenpox) vaccine  You may need this if you have not been vaccinated. Zoster (shingles) vaccine  You may need this after age 57. Measles, mumps, and rubella (MMR) vaccine  You may need at least one dose of MMR if you were born in 1957 or later. You may also need a second dose. Pneumococcal conjugate (PCV13) vaccine  You may need this if you have certain conditions and were not previously vaccinated. Pneumococcal polysaccharide (PPSV23) vaccine  You may need one or two doses if you smoke  cigarettes or if you have certain conditions. Meningococcal conjugate (MenACWY) vaccine  You may need this if you have certain conditions. Hepatitis A vaccine  You may need this if you have certain conditions or if you travel or work in places where you may be exposed to hepatitis A. Hepatitis B vaccine  You may need this if you have certain conditions or if you travel or work in places where you may be exposed to hepatitis B. Haemophilus influenzae type b (Hib) vaccine  You may need this if you have certain conditions. Human papillomavirus (HPV) vaccine  If recommended by your health care provider, you may need three doses over 6 months. You may receive vaccines as individual doses or as more than one vaccine together in one shot (combination vaccines). Talk with your health care provider about the risks and benefits of combination vaccines. What tests do I need? Blood tests  Lipid and cholesterol levels. These may be checked every 5 years, or more frequently if you are over 54 years old.  Hepatitis C test.  Hepatitis B test. Screening  Lung cancer screening. You may have this screening every year starting at age 21 if you have a 30-pack-year history of smoking and currently smoke or have quit within the past 15 years.  Colorectal cancer screening. All adults should have this screening starting at age 56 and continuing until age 74. Your health care provider may recommend screening at age 30 if you are at increased risk. You will have tests every 1-10 years, depending on your results and the type of screening test.  Diabetes screening. This is done by checking your blood sugar (glucose) after you have not eaten for a while (fasting). You may have this done every 1-3 years.  Mammogram. This may be done every 1-2 years. Talk with your health care provider about when you should start having regular mammograms. This may depend on whether you have a family history of breast  cancer.  BRCA-related cancer screening. This may be done if you have a family history of breast, ovarian, tubal, or peritoneal cancers.  Pelvic exam and Pap test. This may be done every 3 years starting at age 21. Starting at age 30, this may be done every 5 years if you have a Pap test in combination with an HPV test. Other tests  Sexually transmitted disease (STD) testing.  Bone density scan. This is done to screen for osteoporosis. You may have this scan if you are at high risk for osteoporosis. Follow these instructions at home: Eating and drinking  Eat a diet that includes fresh fruits and vegetables, whole grains, lean protein, and low-fat dairy.  Take vitamin and mineral supplements as recommended by your health care provider.  Do not drink alcohol if: ? Your health care provider tells you not to drink. ? You are pregnant, may be pregnant, or are planning to become pregnant.  If you drink alcohol: ? Limit how much you have to 0-1 drink a day. ? Be aware of how much alcohol is in your drink. In the [place] one drink equals one 12 oz bottle of beer (355 mL), one 5 oz glass of wine (148 mL), or one 1 oz glass of hard liquor (44 mL). Lifestyle  Take daily care of your teeth and gums.  Stay active. Exercise for at least 30 minutes on 5 or more days each week.  Do not use any products that contain nicotine or tobacco, such as cigarettes, e-cigarettes, and chewing tobacco. If you need help quitting, ask your health care provider.  If you are sexually active, practice safe sex. Use a condom or other form of birth control (contraception) in order to prevent pregnancy and STIs (sexually transmitted infections).  If told by your health care provider, take low-dose aspirin daily starting at age 50. What's next?  Visit your health care provider once a year for a well check visit.  Ask your health care provider how often you should have your eyes and teeth checked.  Stay up to date  on all vaccines. This information is not intended to replace advice given to you by your health care provider. Make sure you discuss any questions you have with your health care provider. Document Revised: 07/01/2018 Document Reviewed: 07/01/2018 Elsevier Patient Education  2020 [employer]  

## 2020-02-24 LAB — VITAMIN D 1,25 DIHYDROXY
Vitamin D 1, 25 (OH)2 Total: 60 pg/mL (ref 18–72)
Vitamin D2 1, 25 (OH)2: <8 pg/mL
Vitamin D3 1, 25 (OH)2: 60 pg/mL

## 2020-02-28 ENCOUNTER — Other Ambulatory Visit: Payer: Self-pay

## 2020-02-29 NOTE — Progress Notes (Signed)
Patient ID: Gloria Bass, female   DOB: [birth year], 54 y.o.   MRN: [id number]   This visit occurred during the SARS-CoV-2 public health emergency.  Safety protocols were in place, including screening questions prior to the visit, additional usage of staff PPE, and extensive cleaning of exam room while observing appropriate contact time as indicated for disinfecting solutions.   HPI  Gloria Bass is a 54 y.o.-year-old female, referred by her PCP, Melissa O'Sullivan-NP, for evaluation and management of subclinical thyrotoxicosis and thyroid nodules.  Patient has a long history of mild subclinical thyrotoxicosis since at least 2009. At that time she had cardiology eval. As she also had palpitations (these persist).  She saw Dr. Loanne Drilling up to 2012.  She is not on a thionamide, but is on metoprolol 50 mg daily.  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  CT was indicated to further evaluate the goiter.  This was ordered by PCP and is pending.     FNA of the right thyroid nodule (01/17/2020): Benign  01/24/2020: CT neck: Left neck mass is actually part of the thyroid  Heterogeneously enhancing right lower neck mass measuring approximately 3.8 x 3 x 1.9 cm (series 4, image 303) seen inferior to the right thyroid lobe extending into the superior mediastinum into the right paratracheal space. This nodule displaces the right common carotid  artery and internal jugular vein laterally. A large 6 x 4 cm left lower neck mass inferior to and separate from the left thyroid lobe is seen crossing the midline with retrosternal extension into the superior mediastinum. This nodule demonstrates multiple coarse calcifications. Posteriorly the mass is seen extending into the left paratracheal space with lateral displacement of the superior mediastinal vessels with mass effect on the esophagus. Medially the mass is seen displacing the trachea posteriorly and laterally, although the lumen appears patent. These masses do not appear to demonstrate invasive features on CT. These masses appear separate from the thyroid gland with a clear fat plane between the masses and the thyroid gland (series 601, image 46-49). These masses appear to displace the thyroid superiorly.  No pathologically enlarged or necrotic lymph nodes.  Normal appearing parotid and submandibular glands.  Mild mucosal thickening within the left sphenoid sinus. Degenerative changes in the cervical spine most significant at C5-6.  CONCLUSION:  1.  Heterogeneously enhancing left greater than right central compartment mass with superior mediastinal extension as described above. This mass/masses appears extrinsic to the thyroid gland and demonstrate a well-demarcated fat plane. Differential diagnostic considerations include mediastinal teratoma, other germ cell tumors and thymic origin neoplasms rather than thyroid nodules. Recommended ultrasound guided fine needle aspiration for further evaluation.  2.  No pathologically enlarged or necrotic lymph nodes.  Pt denies: - feeling nodules in neck - hoarseness - dysphagia - + choking frequently - + burning in neck with choking - SOB with lying down  She has: - + SOB w/ exertion - + fatigue, + insomnia (terminal) - + heat intolerance, no excess sweating - no tremors - no anxiety - + palpitations - no hyperdefecation - no weight loss,  but had some weight gain - + hair loss  Pt does not have a FH of thyroid ds. No FH of thyroid cancer. No h/o radiation tx to head or neck.  No seaweed or kelp, no recent contrast studies. No steroid use. No herbal supplements. + Biotin use - .  Pt. also has a history of HTN, seeing cardiology.  ROS: Constitutional: + see HPI, + nocturia Eyes: no blurry vision, no xerophthalmia ENT: no sore throat, + see HPI Cardiovascular: no CP/+ SOB/+ palpitations/leg swelling Respiratory: no cough/+ SOB Gastrointestinal: no N/V/D/C Musculoskeletal: no muscle/joint aches Skin: no rashes, + hair loss Neurological: no tremors/numbness/tingling/dizziness Psychiatric: No depression/anxiety  Past Medical History:  Diagnosis Date  . Allergic rhinitis   . Cervical stenosis (uterine cervix)   . History of pituitary tumor    prolactinoma  . Hypertension   . Migraines   . PMB (postmenopausal bleeding)   . Subclinical hypothyroidism 05/16/2008   Qualifier: Diagnosis of  By: Tilden Dome     Past Surgical History:  Procedure Laterality Date  . COLONOSCOPY WITH PROPOFOL  2018  . DOBUTAMINE STRESS ECHO  11-30-2017   dr Geraldo Pitter   normal withou evidence ischemia/  normal LVEF  . HYSTEROSCOPY WITH D & C N/A 06/03/2018   Procedure: DILATATION AND CURETTAGE /HYSTEROSCOPY;  Surgeon: Arvella Nigh, MD;  Location: [facility];  Service: Gynecology;  Laterality: N/A;  . TRANSPHENOIDAL PITUITARY RESECTION  age 49  @ [facility]   benign   Social History   Socioeconomic History  . Marital status: Legally Separated    Spouse name: Not on file  . Number of children: 1  . Years of education: Not on file  . Highest education level: Not on file  Occupational History  . Occupation: Government social research officer  Tobacco Use  . Smoking status: Never Smoker  . Smokeless tobacco: Never Used  Substance and Sexual Activity  . Alcohol use: Yes    Comment: occasional  . Drug use: No  . Sexual activity: Not  Currently    Birth control/protection: Post-menopausal  Other Topics Concern  . Not on file  Social History Narrative   Works as a Government social research officer for [employer]   Single   Has son (adult)-  Lives in [place]   Enjoys making jewelry   Spending time with her niece/nephews   No pets   Social Determinants of Radio broadcast assistant Strain:   . Difficulty of Paying Living Expenses:   Food Insecurity:   . Worried About Charity fundraiser in the Last Year:   . Arboriculturist in the Last Year:   Transportation Needs:   . Film/video editor (Medical):   Marland Kitchen Lack of Transportation (Non-Medical):   Physical Activity:   . Days of Exercise per Week:   . Minutes of Exercise per Session:   Stress:   . Feeling of Stress :   Social Connections:   . Frequency of Communication with Friends and Family:   . Frequency of Social Gatherings with Friends and Family:   . Attends Religious Services:   . Active Member of Clubs or Organizations:   . Attends Archivist Meetings:   Marland Kitchen Marital Status:   Intimate Partner Violence:   .  Fear of Current or Ex-Partner:   . Emotionally Abused:   Marland Kitchen Physically Abused:   . Sexually Abused:    Current Outpatient Medications on File Prior to Visit  Medication Sig Dispense Refill  . amLODipine (NORVASC) 10 MG tablet Take 1 tablet (10 mg total) by mouth daily. 90 tablet 1  . hydrochlorothiazide (HYDRODIURIL) 25 MG tablet Take 0.5 tablets (12.5 mg total) by mouth daily. 15 tablet 12  . metoprolol succinate (TOPROL-XL) 50 MG 24 hr tablet Take 1 tablet (50 mg total) by mouth daily. Take with or immediately following a meal. 90 tablet 0  . SUMAtriptan (IMITREX) 100 MG tablet Take 50-100 mg by mouth every 2 (two) hours as needed.      No current facility-administered medications on file prior to visit.   Allergies  Allergen Reactions  . Lisinopril Swelling    Angioedema    Family History  Problem Relation Age of  Onset  . Hypertension Mother   . Hypertension Father     PE: BP 130/80   Pulse 74   Ht 5' 2.5" (1.588 m)   Wt 147 lb (66.7 kg)   SpO2 99%   BMI 26.46 kg/m  Wt Readings from Last 3 Encounters:  03/01/20 147 lb (66.7 kg)  02/21/20 149 lb (67.6 kg)  12/15/19 148 lb 1.9 oz (67.2 kg)   Constitutional: normal weight, in NAD Eyes: PERRLA, EOMI, no exophthalmos, no lid lag, no stare ENT: moist mucous membranes, no thyromegaly, no thyroid bruits, no cervical lymphadenopathy Cardiovascular: RRR, No MRG Respiratory: CTA B Gastrointestinal: abdomen soft, NT, ND, BS+ Musculoskeletal: no deformities, strength intact in all 4 Skin: moist, warm, no rashes Neurological: no tremor with outstretched hands, DTR normal in all 4  ASSESSMENT: 1. Thyrotoxicosis  2.  R Thyroid nodule  3. L Neck mass  PLAN:  1. Patient with a recently found low TSH, with thyrotoxic sxs: weight loss, heat intolerance, hyperdefecation, palpitations, anxiety.  - she does not appear to have exogenous causes for the low TSH.  - We discussed that possible causes of thyrotoxicosis are:  Marland Kitchen Graves ds  (possible) . Thyroiditis (unlikely due to prolonged course) . toxic multinodular goiter/ toxic adenoma (Thyroid uptake and scan from 2009 shows a photopenic defect in the right upper thyroid lobe, but not toxic nodules) . Possible constitutive low TSH - will check the TSH, fT3 and fT4 and also add thyroid stimulating antibodies to screen for Graves' disease.  Unfortunately, she is on biotin so we could not check the tests today.  She will return to have this checked. - If the tests remain abnormal, we may need to repeat an uptake and scan to differentiate between the 3 above possible etiologies  - we discussed about possible modalities of treatment for the above conditions, to include methimazole use, radioactive iodine ablation or (last resort) surgery.  However, if her TSH remains only mildly low with free thyroid hormones,  we may not need treatment. - We will continue her beta-blocker. No tachycardia today. - no signs of Graves' ophthalmopathy: she does not have any double vision, blurry vision, eye pain, chemosis. - RTC in 4 months, but likely sooner for repeat labs  2.  Right thyroid nodule -She has a large 4.1 cm thyroid nodule, which appears to be part of a larger goiter that extends retrosternally -The nodule was biopsied with benign results -She occasionally has choking and also had shortness of breath with exertion  3.  Left neck mass -Patient had a  CT of the neck performed at [facility].  We reviewed the report together.  I do not have images available.  It appears that her left neck mass is unrelated to the right thyroid nodule and eat extends in the upper mediastinum.  Per review of the report, differential diagnoses are: mediastinal teratoma, other germ cell tumors and thymic origin neoplasms. -I did discuss with the patient that in the setting of Graves' disease, thymic hyperplasia is possible and reversible with treatment of the thyroid disease.   -However, the diagnosis is unclear for now and I suggested FNA of this mass.  She agrees.  FNA ordered today. -After the results are back, it is possible that she may require surgery for this mass.  In this case, we need to decide if she also needs to have right thyroid lobectomy, in case the right thyroid nodule is toxic/hot. -I will see the patient back in 3 months, but will be in touch in the interim  Orders Placed This Encounter  Procedures  . NM THYROID MULT UPTAKE W/IMAGING  . [place] FNA SOFT TISSUE  . TSH  . T4, free  . T3, free  . Thyroid stimulating immunoglobulin   Addendum 03/23/2020: Labs and imaging tests still pending.  Philemon Kingdom, MD PhD [facility] Endocrinology

## 2020-03-01 ENCOUNTER — Other Ambulatory Visit: Payer: Self-pay | Admitting: Internal Medicine

## 2020-03-01 ENCOUNTER — Encounter: Payer: Self-pay | Admitting: Internal Medicine

## 2020-03-01 ENCOUNTER — Other Ambulatory Visit: Payer: Self-pay

## 2020-03-01 ENCOUNTER — Ambulatory Visit (INDEPENDENT_AMBULATORY_CARE_PROVIDER_SITE_OTHER): Payer: BC Managed Care – PPO | Admitting: Internal Medicine

## 2020-03-01 ENCOUNTER — Telehealth: Payer: Self-pay | Admitting: Internal Medicine

## 2020-03-01 VITALS — BP 130/80 | HR 74 | Ht 62.5 in | Wt 147.0 lb

## 2020-03-01 DIAGNOSIS — E039 Hypothyroidism, unspecified: Secondary | ICD-10-CM | POA: Diagnosis not present

## 2020-03-01 DIAGNOSIS — E0590 Thyrotoxicosis, unspecified without thyrotoxic crisis or storm: Secondary | ICD-10-CM | POA: Diagnosis not present

## 2020-03-01 DIAGNOSIS — R221 Localized swelling, mass and lump, neck: Secondary | ICD-10-CM

## 2020-03-01 DIAGNOSIS — E038 Other specified hypothyroidism: Secondary | ICD-10-CM

## 2020-03-01 DIAGNOSIS — E041 Nontoxic single thyroid nodule: Secondary | ICD-10-CM

## 2020-03-01 NOTE — Telephone Encounter (Signed)
Ok, I reordered this to be done at [facility].  I did tell the patient at the time of the visit that we may need to move it to the hospital.

## 2020-03-01 NOTE — Telephone Encounter (Signed)
Brittney from [facility] called stating that a PA and the ultrasound tech supervisor reviewed the referral for this patient's biopsy and stated it should be sent to the hospital instead of them.

## 2020-03-01 NOTE — Patient Instructions (Addendum)
Please stay off Biotin for 2 weeks before coming for labs if you are taking 10,000 mcg (1 week for 5000 mcg, immediately for 1000 mcg).  We will order a thyroid uptake and scan at [facility].  I will order a biopsy of the left neck mass at [facility] imaging.  If you are not contacted about this in the next ~3 days, please call the following number to schedule it:  . Zacarias Pontes, Elvina Sidle, and [facility] [employer] central scheduling (for thyroid uptake and scan): [phone] . [facility] (for CT, MRI, ultrasound, and biopsy): [phone]  Please come back for a follow-up appointment in 3 months.

## 2020-03-08 ENCOUNTER — Ambulatory Visit: Payer: BC Managed Care – PPO | Admitting: Cardiology

## 2020-03-20 ENCOUNTER — Encounter (HOSPITAL_COMMUNITY): Payer: Self-pay | Admitting: Radiology

## 2020-03-20 NOTE — Progress Notes (Signed)
Gloria Bass. [facility] Female, 54 y.o., [birth year] MRN:  [id number] Phone:  [phone] (M) ... PCP:  Debbrah Alar, NP Coverage:  Blue [employer] Blue [employer] Comm Ppo Next Appt With Cardiology 04/04/2020 at 4:15 PM  RE: [place] FNA Thyroid Biopsy, Question Received: Today Message Contents  Arne Cleveland, MD  Arlyn Leak   [place] FNA inf L mass/goiter   DDH       Previous Messages   ----- Message -----  From: Garth Bigness D  Sent: 03/20/2020 12:20 PM EDT  To: [facility] Procedure Requests  Subject: [place] FNA Thyroid Biopsy, Question          Patient had Thyroid US done back on 01/11/20 and a Thyroid Biopsy done on 01/17/20 at GI. It looks like biopsy was done on right side thyroid nodule. Provider is requesting that the left side be done this time. Patient had recent imaging done at [facility] on 01/24/20. Told provider we might need that imaging. Please advise if Provider needs to get that imaging from [facility] sent via Pac's or can we use they Thyroid US that was done back on 01/11/20.    Thanks Aniceto Boss

## 2020-03-22 ENCOUNTER — Other Ambulatory Visit: Payer: Self-pay | Admitting: Cardiology

## 2020-03-23 ENCOUNTER — Encounter: Payer: Self-pay | Admitting: Internal Medicine

## 2020-03-28 ENCOUNTER — Ambulatory Visit (HOSPITAL_COMMUNITY): Payer: BC Managed Care – PPO

## 2020-03-30 ENCOUNTER — Other Ambulatory Visit: Payer: Self-pay | Admitting: Radiology

## 2020-03-30 ENCOUNTER — Other Ambulatory Visit: Payer: Self-pay | Admitting: Student

## 2020-03-31 ENCOUNTER — Encounter: Payer: Self-pay | Admitting: Internal Medicine

## 2020-04-03 ENCOUNTER — Other Ambulatory Visit: Payer: Self-pay

## 2020-04-03 ENCOUNTER — Other Ambulatory Visit: Payer: Self-pay | Admitting: Internal Medicine

## 2020-04-03 ENCOUNTER — Ambulatory Visit (HOSPITAL_COMMUNITY)
Admission: RE | Admit: 2020-04-03 | Discharge: 2020-04-03 | Disposition: A | Discharge status: Home / Self Care | Payer: BC Managed Care – PPO | Source: Ambulatory Visit | Attending: Internal Medicine | Admitting: Internal Medicine

## 2020-04-03 DIAGNOSIS — E041 Nontoxic single thyroid nodule: Secondary | ICD-10-CM | POA: Diagnosis not present

## 2020-04-03 DIAGNOSIS — R221 Localized swelling, mass and lump, neck: Secondary | ICD-10-CM

## 2020-04-03 LAB — CBC WITH DIFFERENTIAL/PLATELET
Abs Immature Granulocytes: 0.03 10*3/uL (ref 0.00–0.07)
Basophils Absolute: 0 10*3/uL (ref 0.0–0.1)
Basophils Relative: 0 %
Eosinophils Absolute: 0.1 10*3/uL (ref 0.0–0.5)
Eosinophils Relative: 3 %
HCT: 42.5 % (ref 36.0–46.0)
Hemoglobin: 14.1 g/dL (ref 12.0–15.0)
Immature Granulocytes: 1 %
Lymphocytes Relative: 30 %
Lymphs Abs: 1.6 10*3/uL (ref 0.7–4.0)
MCH: 30.3 pg (ref 26.0–34.0)
MCHC: 33.2 g/dL (ref 30.0–36.0)
MCV: 91.2 fL (ref 80.0–100.0)
Monocytes Absolute: 0.4 10*3/uL (ref 0.1–1.0)
Monocytes Relative: 8 %
Neutro Abs: 3 10*3/uL (ref 1.7–7.7)
Neutrophils Relative %: 58 %
Platelets: 289 10*3/uL (ref 150–400)
RBC: 4.66 MIL/uL (ref 3.87–5.11)
RDW: 11.9 % (ref 11.5–15.5)
WBC: 5.2 10*3/uL (ref 4.0–10.5)
nRBC: 0 % (ref 0.0–0.2)

## 2020-04-03 LAB — PROTIME-INR
INR: 0.9 (ref 0.8–1.2)
Prothrombin Time: 12.2 seconds (ref 11.4–15.2)

## 2020-04-03 MED ORDER — LIDOCAINE HCL (PF) 1 % IJ SOLN
INTRAMUSCULAR | Status: AC
  Filled 2020-04-03: qty 30

## 2020-04-03 MED ORDER — SODIUM CHLORIDE 0.9 % IV SOLN: INTRAVENOUS | Status: DC

## 2020-04-04 ENCOUNTER — Ambulatory Visit: Payer: BC Managed Care – PPO | Admitting: Cardiology

## 2020-04-04 LAB — CYTOLOGY - NON PAP

## 2020-04-05 NOTE — Telephone Encounter (Signed)
Called the pt and discussed to her. Explained my decision to send her to [facility] for the Bx (as suggested by [place]).  Apologized for the confusion. Pt. Understood. Also, discussed about the results >> thyroid tissue, benign.

## 2020-04-25 ENCOUNTER — Encounter: Payer: Self-pay | Admitting: Cardiology

## 2020-04-25 ENCOUNTER — Other Ambulatory Visit: Payer: Self-pay

## 2020-04-25 ENCOUNTER — Other Ambulatory Visit: Payer: Self-pay | Admitting: Cardiology

## 2020-04-25 ENCOUNTER — Ambulatory Visit: Payer: BC Managed Care – PPO | Admitting: Cardiology

## 2020-04-25 VITALS — BP 110/72 | HR 78 | Ht 62.0 in | Wt 151.0 lb

## 2020-04-25 DIAGNOSIS — I10 Essential (primary) hypertension: Secondary | ICD-10-CM

## 2020-04-25 DIAGNOSIS — E041 Nontoxic single thyroid nodule: Secondary | ICD-10-CM

## 2020-04-25 DIAGNOSIS — E038 Other specified hypothyroidism: Secondary | ICD-10-CM

## 2020-04-25 MED ORDER — HYDROCHLOROTHIAZIDE 25 MG PO TABS: 12.5000 mg | ORAL_TABLET | Freq: Every day | ORAL | 3 refills | Status: DC

## 2020-04-25 MED ORDER — AMLODIPINE BESYLATE 10 MG PO TABS: ORAL_TABLET | ORAL | 3 refills | Status: DC

## 2020-04-25 MED ORDER — METOPROLOL SUCCINATE ER 50 MG PO TB24: 50.0000 mg | ORAL_TABLET | Freq: Every day | ORAL | 3 refills | Status: DC

## 2020-04-25 NOTE — Patient Instructions (Signed)

## 2020-04-25 NOTE — Addendum Note (Signed)
Addended by: Truddie Hidden on: 04/25/2020 03:56 PM   Modules accepted: Orders

## 2020-04-25 NOTE — Progress Notes (Signed)
Cardiology Office Note:    Date:  04/25/2020   ID:  Gloria Bass, DOB February 14, 1966, MRN [id number]  PCP:  Debbrah Alar, NP  Cardiologist:  Jenean Lindau, MD   Referring MD: Maylon Peppers, MD    ASSESSMENT:    1. Essential hypertension    PLAN:    In order of problems listed above:  1. Primary prevention stressed with the patient.  Importance of compliance with diet medication stressed and she vocalized understanding. 2. Essential hypertension: Blood pressure is stable.  Diet was emphasized.  Importance of regular exercise stressed.  I advised her to walk at least 5 days a week 30 minutes a day with brisk walking and she promises to do so.  Diet was emphasized. 3. Goiter: Managed by her primary care physician in association with specialist.  She is undergoing further evaluation by this.  I reviewed the ultrasound report. 4. Patient will be seen in follow-up appointment in 6 months or earlier if the patient has any concerns    Medication Adjustments/Labs and Tests Ordered: Current medicines are reviewed at length with the patient today.  Concerns regarding medicines are outlined above.  No orders of the defined types were placed in this encounter.  No orders of the defined types were placed in this encounter.    No chief complaint on file.    History of Present Illness:    Gloria Bass is a 54 y.o. female.  Patient has past medical history of essential hypertension.  Patient has been diagnosed with a large goiter.  She is being evaluated for this.  She denies any chest pain orthopnea or PND.  She leads a sedentary lifestyle.  At the time of my evaluation, the patient is alert awake oriented and in no distress.  Past Medical History:  Diagnosis Date  . Allergic rhinitis   . Cervical stenosis (uterine cervix)   . History of pituitary tumor    prolactinoma  . Hypertension   . Migraines   . PMB (postmenopausal bleeding)   . Subclinical hypothyroidism  05/16/2008   Qualifier: Diagnosis of  By: Tilden Dome      Past Surgical History:  Procedure Laterality Date  . COLONOSCOPY WITH PROPOFOL  2018  . DOBUTAMINE STRESS ECHO  11-30-2017   dr Geraldo Pitter   normal withou evidence ischemia/  normal LVEF  . HYSTEROSCOPY WITH D & C N/A 06/03/2018   Procedure: DILATATION AND CURETTAGE /HYSTEROSCOPY;  Surgeon: Arvella Nigh, MD;  Location: [facility];  Service: Gynecology;  Laterality: N/A;  . TRANSPHENOIDAL PITUITARY RESECTION  age 71  @ [facility]   benign    Current Medications: Current Meds  Medication Sig  . amLODipine (NORVASC) 10 MG tablet TAKE 1 TABLET(10 MG) BY MOUTH DAILY (Patient taking differently: Take 10 mg by mouth daily. )  . cholecalciferol (VITAMIN D3) 25 MCG (1000 UNIT) tablet Take 1,000 Units by mouth daily.  . hydrochlorothiazide (HYDRODIURIL) 25 MG tablet Take 0.5 tablets (12.5 mg total) by mouth daily.  . metoprolol succinate (TOPROL-XL) 50 MG 24 hr tablet Take 1 tablet (50 mg total) by mouth daily. Take with or immediately following a meal.  . SUMAtriptan (IMITREX) 100 MG tablet Take 50-100 mg by mouth every 2 (two) hours as needed for migraine.      Allergies:   Lisinopril   Social History   Socioeconomic History  . Marital status: Legally Separated    Spouse name: Not on file  . Number of children: 1  .  Years of education: Not on file  . Highest education level: Not on file  Occupational History  . Occupation: Government social research officer  Tobacco Use  . Smoking status: Never Smoker  . Smokeless tobacco: Never Used  Vaping Use  . Vaping Use: Never used  Substance and Sexual Activity  . Alcohol use: Yes    Comment: occasional  . Drug use: No  . Sexual activity: Not Currently    Birth control/protection: Post-menopausal  Other Topics Concern  . Not on file  Social History Narrative   Works as a Government social research officer for [employer]   Single   Has son (adult)-  Lives in [place]    Enjoys making jewelry   Spending time with her niece/nephews   No pets   Social Determinants of Radio broadcast assistant Strain:   . Difficulty of Paying Living Expenses:   Food Insecurity:   . Worried About Charity fundraiser in the Last Year:   . Arboriculturist in the Last Year:   Transportation Needs:   . Film/video editor (Medical):   Marland Kitchen Lack of Transportation (Non-Medical):   Physical Activity:   . Days of Exercise per Week:   . Minutes of Exercise per Session:   Stress:   . Feeling of Stress :   Social Connections:   . Frequency of Communication with Friends and Family:   . Frequency of Social Gatherings with Friends and Family:   . Attends Religious Services:   . Active Member of Clubs or Organizations:   . Attends Archivist Meetings:   Marland Kitchen Marital Status:      Family History: The patient's family history includes Hypertension in her father and mother.  ROS:   Please see the history of present illness.    All other systems reviewed and are negative.  EKGs/Labs/Other Studies Reviewed:    The following studies were reviewed today: I discussed my findings with the patient at length including lipids   Recent Labs: 10/26/2019: ALT 22 01/06/2020: TSH 0.22 02/21/2020: BUN 11; Creatinine, Ser 0.67; Potassium 4.0; Sodium 142 04/03/2020: Hemoglobin 14.1; Platelets 289  Recent Lipid Panel    Component Value Date/Time   CHOL 176 10/26/2019 1058   TRIG 151 (H) 10/26/2019 1058   HDL 57 10/26/2019 1058   CHOLHDL 3.1 10/26/2019 1058   LDLCALC 93 10/26/2019 1058    Physical Exam:    VS:  BP 110/72   Pulse 78   Ht 5\' 2"  (1.575 m)   Wt 151 lb (68.5 kg)   SpO2 96%   BMI 27.62 kg/m     Wt Readings from Last 3 Encounters:  04/25/20 151 lb (68.5 kg)  04/03/20 148 lb (67.1 kg)  03/01/20 147 lb (66.7 kg)     GEN: Patient is in no acute distress HEENT: Normal NECK: No JVD; No carotid bruits LYMPHATICS: No lymphadenopathy CARDIAC: Hear sounds  regular, 2/6 systolic murmur at the apex. RESPIRATORY:  Clear to auscultation without rales, wheezing or rhonchi  ABDOMEN: Soft, non-tender, non-distended MUSCULOSKELETAL:  No edema; No deformity  SKIN: Warm and dry NEUROLOGIC:  Alert and oriented x 3 PSYCHIATRIC:  Normal affect   Signed, Jenean Lindau, MD  04/25/2020 3:32 PM    [facility]

## 2020-05-14 ENCOUNTER — Ambulatory Visit: Payer: BC Managed Care – PPO | Admitting: Internal Medicine

## 2020-05-15 ENCOUNTER — Other Ambulatory Visit: Payer: Self-pay | Admitting: Obstetrics and Gynecology

## 2020-05-15 DIAGNOSIS — Z1231 Encounter for screening mammogram for malignant neoplasm of breast: Secondary | ICD-10-CM

## 2020-06-28 DIAGNOSIS — L710 Perioral dermatitis: Secondary | ICD-10-CM | POA: Diagnosis not present

## 2020-06-29 ENCOUNTER — Ambulatory Visit
Admission: RE | Admit: 2020-06-29 | Discharge: 2020-06-29 | Disposition: A | Discharge status: Home / Self Care | Payer: BC Managed Care – PPO | Source: Ambulatory Visit | Attending: Obstetrics and Gynecology | Admitting: Obstetrics and Gynecology

## 2020-06-29 ENCOUNTER — Other Ambulatory Visit: Payer: Self-pay

## 2020-06-29 DIAGNOSIS — Z1231 Encounter for screening mammogram for malignant neoplasm of breast: Secondary | ICD-10-CM

## 2020-08-02 DIAGNOSIS — L710 Perioral dermatitis: Secondary | ICD-10-CM | POA: Diagnosis not present

## 2020-08-11 ENCOUNTER — Telehealth: Payer: Self-pay

## 2020-08-11 NOTE — Telephone Encounter (Signed)
Contacted the pt today to find out if she still plans on getting the [place] FNA that Dr. Cruzita Lederer ordered for her on 03/01/20 this does not need a PA and would be scheduled at GI

## 2020-08-22 ENCOUNTER — Ambulatory Visit: Payer: BC Managed Care – PPO | Admitting: Family

## 2020-09-07 ENCOUNTER — Other Ambulatory Visit: Payer: Self-pay

## 2020-09-07 ENCOUNTER — Ambulatory Visit: Payer: BC Managed Care – PPO | Attending: Internal Medicine

## 2020-09-07 ENCOUNTER — Encounter: Payer: Self-pay | Admitting: Family

## 2020-09-07 ENCOUNTER — Ambulatory Visit: Payer: BC Managed Care – PPO | Admitting: Family

## 2020-09-07 ENCOUNTER — Other Ambulatory Visit (HOSPITAL_BASED_OUTPATIENT_CLINIC_OR_DEPARTMENT_OTHER): Payer: Self-pay | Admitting: Internal Medicine

## 2020-09-07 VITALS — BP 132/77 | HR 66 | Temp 98.4°F | Resp 16 | Ht 62.0 in | Wt 150.0 lb

## 2020-09-07 DIAGNOSIS — I10 Essential (primary) hypertension: Secondary | ICD-10-CM | POA: Diagnosis not present

## 2020-09-07 DIAGNOSIS — E0590 Thyrotoxicosis, unspecified without thyrotoxic crisis or storm: Secondary | ICD-10-CM | POA: Diagnosis not present

## 2020-09-07 DIAGNOSIS — Z23 Encounter for immunization: Secondary | ICD-10-CM

## 2020-09-07 NOTE — Progress Notes (Signed)
Subjective:    Patient ID: Gloria Bass, female    DOB: Jan 04, 1966, 53 y.o.   MRN: [id number]  HPI  Patient is a 54 yr old female who presents today for follow up.  Hyperthyroid/Thyroid nodules.  She saw endocrinology 03/01/20.  It looks from the note that lab work was ordered that visit but not completed. FNA was recommended of the left neck mass.  She had a FNA of the left thyroid mass on 04/03/20.  Cytology noted benign follicular nodule.  Lab Results  Component Value Date   TSH 0.22 (L) 01/06/2020   HTN- amlodipine 10mg , hctz 25, toprol xl 50mg  BP Readings from Last 3 Encounters:  09/07/20 132/77  04/25/20 110/72  04/03/20 134/78   No recent headaches.   Review of Systems   See HPI  Past Medical History:  Diagnosis Date  . Allergic rhinitis   . Cervical stenosis (uterine cervix)   . History of pituitary tumor    prolactinoma  . Hypertension   . Migraines   . PMB (postmenopausal bleeding)   . Subclinical hypothyroidism 05/16/2008   Qualifier: Diagnosis of  By: Tilden Dome       Social History   Socioeconomic History  . Marital status: Legally Separated    Spouse name: Not on file  . Number of children: 1  . Years of education: Not on file  . Highest education level: Not on file  Occupational History  . Occupation: Government social research officer  Tobacco Use  . Smoking status: Never Smoker  . Smokeless tobacco: Never Used  Vaping Use  . Vaping Use: Never used  Substance and Sexual Activity  . Alcohol use: Yes    Comment: occasional  . Drug use: No  . Sexual activity: Not Currently    Birth control/protection: Post-menopausal  Other Topics Concern  . Not on file  Social History Narrative   Works as a Government social research officer for [employer]   Single   Has son (adult)-  Lives in [place]   Enjoys making jewelry   Spending time with her niece/nephews   No pets   Social Determinants of Radio broadcast assistant Strain:   . Difficulty of  Paying Living Expenses: Not on file  Food Insecurity:   . Worried About Charity fundraiser in the Last Year: Not on file  . Ran Out of Food in the Last Year: Not on file  Transportation Needs:   . Lack of Transportation (Medical): Not on file  . Lack of Transportation (Non-Medical): Not on file  Physical Activity:   . Days of Exercise per Week: Not on file  . Minutes of Exercise per Session: Not on file  Stress:   . Feeling of Stress : Not on file  Social Connections:   . Frequency of Communication with Friends and Family: Not on file  . Frequency of Social Gatherings with Friends and Family: Not on file  . Attends Religious Services: Not on file  . Active Member of Clubs or Organizations: Not on file  . Attends Archivist Meetings: Not on file  . Marital Status: Not on file  Intimate Partner Violence:   . Fear of Current or Ex-Partner: Not on file  . Emotionally Abused: Not on file  . Physically Abused: Not on file  . Sexually Abused: Not on file    Past Surgical History:  Procedure Laterality Date  . COLONOSCOPY WITH PROPOFOL  2018  . DOBUTAMINE STRESS  ECHO  11-30-2017   dr Geraldo Pitter   normal withou evidence ischemia/  normal LVEF  . HYSTEROSCOPY WITH D & C N/A 06/03/2018   Procedure: DILATATION AND CURETTAGE /HYSTEROSCOPY;  Surgeon: Arvella Nigh, MD;  Location: [place];  Service: Gynecology;  Laterality: N/A;  . TRANSPHENOIDAL PITUITARY RESECTION  age 56  @ [facility]   benign    Family History  Problem Relation Age of Onset  . Hypertension Mother   . Hypertension Father     Allergies  Allergen Reactions  . Lisinopril Swelling    Angioedema     Current Outpatient Medications on File Prior to Visit  Medication Sig Dispense Refill  . amLODipine (NORVASC) 10 MG tablet TAKE 1 TABLET(10 MG) BY MOUTH DAILY 90 tablet 3  . Cholecalciferol (VITAMIN D) 125 MCG (5000 UT) CAPS Take by mouth.    . SUMAtriptan (IMITREX) 100 MG tablet Take 50-100 mg by  mouth every 2 (two) hours as needed for migraine.     . hydrochlorothiazide (HYDRODIURIL) 25 MG tablet Take 0.5 tablets (12.5 mg total) by mouth daily. 45 tablet 3  . metoprolol succinate (TOPROL-XL) 50 MG 24 hr tablet Take 1 tablet (50 mg total) by mouth daily. Take with or immediately following a meal. 90 tablet 3   No current facility-administered medications on file prior to visit.    BP 132/77 (BP Location: Right Arm, Patient Position: Sitting, Cuff Size: Small)   Pulse 66   Temp 98.4 F (36.9 C) (Oral)   Resp 16   Ht 5\' 2"  (1.575 m)   Wt 150 lb (68 kg)   SpO2 99%   BMI 27.44 kg/m        Objective:   Physical Exam Constitutional:      Appearance: She is well-developed.  Neck:     Thyroid: No thyromegaly.  Cardiovascular:     Rate and Rhythm: Normal rate and regular rhythm.     Heart sounds: Normal heart sounds. No murmur heard.   Pulmonary:     Effort: Pulmonary effort is normal. No respiratory distress.     Breath sounds: Normal breath sounds. No wheezing.  Musculoskeletal:     Cervical back: Neck supple.  Skin:    General: Skin is warm and dry.  Neurological:     Mental Status: She is alert and oriented to person, place, and time.  Psychiatric:        Behavior: Behavior normal.        Thought Content: Thought content normal.        Judgment: Judgment normal.           Assessment & Plan:  Hyperthyroid- obtain follow up TFT's.  She denies weight loss or heat intolerance. She would like for me to set her up with endocrinology at our location for convenience.  Referral placed.   HTN- bp stable. Continue amlodipine 10mg , hctz 25, toprol xl 50mg .  30 minutes spent on today's visit. Time was spent counseling patient, reviewing medical records and formulating medical plan.  This visit occurred during the SARS-CoV-2 public health emergency.  Safety protocols were in place, including screening questions prior to the visit, additional usage of staff PPE, and  extensive cleaning of exam room while observing appropriate contact time as indicated for disinfecting solutions.

## 2020-09-07 NOTE — Progress Notes (Signed)
   Covid-19 Vaccination Clinic  Name:  Gloria Bass    MRN: [id number] DOB: Oct 11, 1966  09/07/2020  [name] was observed post Covid-19 immunization for 15 minutes without incident. She was provided with Vaccine Information Sheet and instruction to access the V-Safe system.   Vaccinated by Unknown Foley  [name] was instructed to call 911 with any severe reactions post vaccine: Marland Kitchen Difficulty breathing  . Swelling of face and throat  . A fast heartbeat  . A bad rash all over body  . Dizziness and weakness

## 2020-09-08 LAB — BASIC METABOLIC PANEL
BUN: 15 mg/dL (ref 7–25)
CO2: 29 mmol/L (ref 20–32)
Calcium: 9.9 mg/dL (ref 8.6–10.4)
Chloride: 107 mmol/L (ref 98–110)
Creat: 0.71 mg/dL (ref 0.50–1.05)
Glucose, Bld: 86 mg/dL (ref 65–99)
Potassium: 4.3 mmol/L (ref 3.5–5.3)
Sodium: 143 mmol/L (ref 135–146)

## 2020-09-08 LAB — T3, FREE: T3, Free: 3.4 pg/mL (ref 2.3–4.2)

## 2020-09-08 LAB — TSH: TSH: 0.15 mIU/L — ABNORMAL LOW

## 2020-09-08 LAB — T4, FREE: Free T4: 1.2 ng/dL (ref 0.8–1.8)

## 2020-09-13 MED FILL — PFIZER-BIONTECH COVID-19 VA: 30 | 1 days supply | Qty: 0 | Fill #0

## 2020-09-26 ENCOUNTER — Other Ambulatory Visit: Payer: Self-pay | Admitting: Cardiology

## 2020-09-26 ENCOUNTER — Other Ambulatory Visit: Payer: Self-pay

## 2020-09-26 MED ORDER — METOPROLOL SUCCINATE ER 50 MG PO TB24: 50.0000 mg | ORAL_TABLET | Freq: Every day | ORAL | 3 refills | Status: DC

## 2020-09-26 NOTE — Telephone Encounter (Signed)
Refill sent in per request.  

## 2020-09-26 NOTE — Telephone Encounter (Signed)
*  STAT* If patient is at the pharmacy, call can be transferred to refill team.   1. Which medications need to be refilled? (please list name of each medication and dose if known)  metoprolol succinate (TOPROL-XL) 50 MG 24 hr tablet(Expired)  2. Which pharmacy/location (including street and city if local pharmacy) is medication to be sent to? [id number] - [place], [place] - [address]  3. Do they need a 30 day or 90 day supply? 90 with refills  Patient is out of medication

## 2020-10-22 DIAGNOSIS — L710 Perioral dermatitis: Secondary | ICD-10-CM | POA: Diagnosis not present

## 2020-10-22 DIAGNOSIS — L811 Chloasma: Secondary | ICD-10-CM | POA: Diagnosis not present

## 2020-11-20 ENCOUNTER — Encounter: Payer: Self-pay | Admitting: Internal Medicine

## 2020-11-20 ENCOUNTER — Other Ambulatory Visit: Payer: Self-pay

## 2020-11-20 ENCOUNTER — Ambulatory Visit (INDEPENDENT_AMBULATORY_CARE_PROVIDER_SITE_OTHER): Payer: BC Managed Care – PPO | Admitting: Internal Medicine

## 2020-11-20 VITALS — BP 140/82 | HR 81 | Ht 62.0 in | Wt 150.2 lb

## 2020-11-20 DIAGNOSIS — E0590 Thyrotoxicosis, unspecified without thyrotoxic crisis or storm: Secondary | ICD-10-CM | POA: Diagnosis not present

## 2020-11-20 DIAGNOSIS — E042 Nontoxic multinodular goiter: Secondary | ICD-10-CM

## 2020-11-20 DIAGNOSIS — R748 Abnormal levels of other serum enzymes: Secondary | ICD-10-CM

## 2020-11-20 HISTORY — DX: Thyrotoxicosis, unspecified without thyrotoxic crisis or storm: E05.90

## 2020-11-20 HISTORY — DX: Nontoxic multinodular goiter: E04.2

## 2020-11-20 NOTE — Progress Notes (Signed)
Name: Gloria Bass  MRN/ DOB: [id number], [birth year]    Age/ Sex: 55 y.o., female    PCP: Debbrah Alar, NP   Reason for Endocrinology Evaluation: Hyperthyroidism      Date of Initial Endocrinology Evaluation: 11/20/2020     HPI: Gloria Bass is a 55 y.o. female with a past medical history of HTN.  The patient presented for initial endocrinology clinic visit on 11/20/2020 for consultative assistance with her Hyperthyroidism.   Pt has been diagnosed with subclinical hyperthyroidism since 2009 during evaluation for palpitations. She saw Dr. Loanne Drilling at the time and had an uptake and scan in 2009 with a normal 24 hour I 131 uptake = 21 % (normal 10-30%) but there was a questionable cold nodule at the right superior thyroid.   No treatment was warranted at the time.   Pt was noted with elevated BP by cardiology and was encouraged to address her abnormal thyroid function.   A thyroid ultrasound (01/2020)  revealed MNG with a dominant right inferior  thyroid nodule ( 4.1 cm) as well as a large 6.9 x 4.7 x 6.2 cm mass inferior to the isthmus and left thyroid gland. 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   A CT scan of the neck done at [facility]  (01/2020) revealed a Heterogeneously enhancing left greater than right central compartment mass with superior mediastinal extension. This mass/masses appears extrinsic to the thyroid gland and demonstrate a well-demarcated fat plane . Differential diagnostic considerations include mediastinal teratoma, other germ cell tumors and thymic origin neoplasms rather than thyroid nodules. Recommended ultrasound guided fine needle aspiration for further evaluation.   She is S/p benign FNA on 01/17/2020 of the right inferior nodule. An attempt at biopsying the left   By 02/2020 saw Dr. Cruzita Lederer    An attempt at biopsying the left neck mass through interventional radiology was unsuccessful  and instead she had a benign FNA of the left lobe (04/2020)    Today she denies weight loss or diarrhea  Denies local neck swelling but has occasional dysphagia ( to certain foods) as well as SOB.   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 Prolactin level normal .     HISTORY:  Past Medical History:  Past Medical History:  Diagnosis Date  . Allergic rhinitis   . Cervical stenosis (uterine cervix)   . History of pituitary tumor    prolactinoma  . Hypertension   . Migraines   . PMB (postmenopausal bleeding)   . Subclinical hypothyroidism 05/16/2008   Qualifier: Diagnosis of  By: Tilden Dome     Past Surgical History:  Past Surgical History:  Procedure Laterality Date  . COLONOSCOPY WITH PROPOFOL  2018  . DOBUTAMINE STRESS ECHO  11-30-2017   dr Geraldo Pitter   normal withou evidence ischemia/  normal LVEF  . HYSTEROSCOPY WITH D & C N/A 06/03/2018   Procedure: DILATATION AND CURETTAGE /HYSTEROSCOPY;  Surgeon: Arvella Nigh, MD;  Location: [place];  Service: Gynecology;  Laterality: N/A;  . TRANSPHENOIDAL PITUITARY RESECTION  age 45  @ [facility]   benign      Social History:  reports that she has never smoked. She has never used smokeless tobacco. She reports current alcohol use. She reports that she does  not use drugs.  Family History: family history includes Hypertension in her father and mother.   HOME MEDICATIONS: Allergies as of 11/20/2020      Reactions   Lisinopril Swelling   Angioedema      Medication List       Accurate as of November 20, 2020  3:46 PM. If you have any questions, ask your nurse or doctor.        amLODipine 10 MG tablet Commonly known as: NORVASC TAKE 1 TABLET(10 MG) BY MOUTH DAILY   hydrochlorothiazide 25 MG tablet Commonly known as: HYDRODIURIL Take 0.5 tablets (12.5 mg total)  by mouth daily.   metoprolol succinate 50 MG 24 hr tablet Commonly known as: TOPROL-XL Take 1 tablet (50 mg total) by mouth daily. Take with or immediately following a meal.   SUMAtriptan 100 MG tablet Commonly known as: IMITREX Take 50-100 mg by mouth every 2 (two) hours as needed for migraine.   Vitamin D 125 MCG (5000 UT) Caps Take by mouth.         REVIEW OF SYSTEMS: A comprehensive ROS was conducted with the patient and is negative except as per HPI   OBJECTIVE:  VS: BP 140/82   Pulse 81   Ht 5' 2" (1.575 m)   Wt 150 lb 4 oz (68.2 kg)   SpO2 98%   BMI 27.48 kg/m    Wt Readings from Last 3 Encounters:  11/20/20 150 lb 4 oz (68.2 kg)  09/07/20 150 lb (68 kg)  04/25/20 151 lb (68.5 kg)     EXAM: General: Pt appears well and is in NAD  Neck: General: Supple without adenopathy. Thyroid: Right thyroid nodule appreciated   Lungs: Clear with good BS bilat with no rales, rhonchi, or wheezes  Heart: Auscultation: RRR.  Abdomen: Normoactive bowel sounds, soft, nontender, without masses or organomegaly palpable  Extremities:  BL LE: No pretibial edema normal ROM and strength.  Neuro: Cranial nerves: II - XII grossly intact  Motor: Normal strength throughout DTRs: 2+ and symmetric in UE without delay in relaxation phase  Mental Status: Judgment, insight: Intact Memory: Intact for recent and remote events Mood and affect: No depression, anxiety, or agitation     DATA REVIEWED:   Results for Gloria, Devine Allyssa Bass (MRN [id number]) as of 11/21/2020 16:28  Ref. Range 11/20/2020 14:54  Sodium Latest Ref Range: 135 - 145 mEq/L 142  Potassium Latest Ref Range: 3.5 - 5.1 mEq/L 4.6  Chloride Latest Ref Range: 96 - 112 mEq/L 107  CO2 Latest Ref Range: 19 - 32 mEq/L 29  Glucose Latest Ref Range: 70 - 99 mg/dL 89  BUN Latest Ref Range: 6 - 23 mg/dL 11  Creatinine Latest Ref Range: 0.40 - 1.20 mg/dL 0.91  Calcium Latest Ref Range: 8.4 - 10.5 mg/dL 9.9  Alkaline Phosphatase  Latest Ref Range: 39 - 117 U/L 123 (H)  Albumin Latest Ref Range: 3.5 - 5.2 g/dL 4.8  AST Latest Ref Range: 0 - 37 U/L 18  ALT Latest Ref Range: 0 - 35 U/L 14  Total Protein Latest Ref Range: 6.0 - 8.3 g/dL 7.0  Total Bilirubin Latest Ref Range: 0.2 - 1.2 mg/dL 0.3  GFR Latest Ref Range: >60.00 mL/min 70.76  WBC Latest Ref Range: 4.0 - 10.5 K/uL 6.2  RBC Latest Ref Range: 3.87 - 5.11 Mil/uL 4.71  Hemoglobin Latest Ref Range: 12.0 - 15.0 g/dL 14.3  HCT Latest Ref Range: 36.0 - 46.0 % 42.8  MCV Latest Ref Range: 78.0 -  100.0 fl 90.8  MCHC Latest Ref Range: 30.0 - 36.0 g/dL 33.4  RDW Latest Ref Range: 11.5 - 15.5 % 13.5  Platelets Latest Ref Range: 150.0 - 400.0 K/uL 294.0  Neutrophils Latest Ref Range: 43.0 - 77.0 % 62.2  Lymphocytes Latest Ref Range: 12.0 - 46.0 % 27.3  Monocytes Relative Latest Ref Range: 3.0 - 12.0 % 6.9  Eosinophil Latest Ref Range: 0.0 - 5.0 % 2.8  Basophil Latest Ref Range: 0.0 - 3.0 % 0.8  NEUT# Latest Ref Range: 1.4 - 7.7 K/uL 3.9  Lymphocyte # Latest Ref Range: 0.7 - 4.0 K/uL 1.7  Monocyte # Latest Ref Range: 0.1 - 1.0 K/uL 0.4  Eosinophils Absolute Latest Ref Range: 0.0 - 0.7 K/uL 0.2  Basophils Absolute Latest Ref Range: 0.0 - 0.1 K/uL 0.1  TSH Latest Ref Range: 0.35 - 4.50 uIU/mL 0.14 (L)  Triiodothyronine (T3) Latest Ref Range: 76 - 181 ng/dL 117  T4,Free(Direct) Latest Ref Range: 0.60 - 1.60 ng/dL 0.77     CT neck 01/24/2020   Heterogeneously enhancing right lower neck mass measuring approximately 3.8 x 3 x 1.9 cm (series 4, image 303) seen inferior to the right thyroid lobe extending into the superior mediastinum into the right paratracheal space. This nodule displaces the right common carotid artery and internal jugular vein laterally. A large 6 x 4 cm left lower neck mass inferior to and separate from the left thyroid lobe is seen crossing the midline with retrosternal extension into the superior mediastinum. This nodule demonstrates multiple coarse  calcifications. Posteriorly the mass is seen extending into the left paratracheal space with lateral displacement of the superior mediastinal vessels with mass effect on the esophagus. Medially the mass is seen displacing the trachea posteriorly and laterally, although the lumen appears patent. These masses do not appear to demonstrate invasive features on CT. These masses appear separate from the thyroid gland with a clear fat plane between the masses and the thyroid gland (series 601, image 46-49). These masses appear to displace the thyroid superiorly.   No pathologically enlarged or necrotic lymph nodes.   Normal appearing parotid and submandibular glands.   Mild mucosal thickening within the left sphenoid sinus. Degenerative changes in the cervical spine most significant at C5-6.    FNA left nodule 04/03/2020  THYROID, LEFT, FINE NEEDLE ASPIRATION:    FINAL MICROSCOPIC DIAGNOSIS:  - Consistent with benign follicular nodule (Bethesda category II)    FNA right inferior nodule 01/17/2020 Clinical History: Right; Inferior; 4.1 cm; Other 2 dimensions: 2.4 x 2.4  cm; Solid/almost completely solid; Hypoechoic; TI-RADS:4  Specimen Submitted: A. THYROID GLAND, RIGHT, RLP, FINE NEEDLE  ASPIRATION:    FINAL MICROSCOPIC DIAGNOSIS:  - Consistent with benign follicular nodule (Bethesda category II)     Thyroid uptake and scan 06/27/2008   Findings: Uniform uptake within the thyroid gland. Small focus of  photopenia in the upper pole on the right measuring approximately 1  cm. Gland does not appear enlarged.    24 hour I 131 uptake = 21 % (normal 10-30%)    IMPRESSION:    1.. Normal thyroid uptake.  2.. Suggestion of a nodule in the upper pole on the right,  otherwise homogeneous uptake.  ASSESSMENT/PLAN/RECOMMENDATIONS:   1. Subclinical Hyperthyroidism:  - Low TSH since 2009 with a nadir of 0.07 uIU/ml with normal FT4.  - She is clinically euthyroid  -The causes of  subclinical hyperthyroidism include autonomously functioning thyroid adenomas and multinodular goiters, Graves' disease, constitutional low TSH. Of  note, the pt is S/P pituitary adenoma resection and low TSH could be a consequence of this.  - Thyroid uptake and scan in 2009 was homogenous and normal    - Most patients with subclinical hyperthyroidism have no clinical manifestations of hyperthyroidism, and those symptoms that are present (eg, tachycardia, tremor, dyspnea on exertion, weight loss) are mild and nonspecific." However, subclinical hyperthyroidism is associated with an increased risk of atrial fibrillation and, primarily in postmenopausal women, a decrease in bone mineral density.    - We extensively discussed the various treatment options for hyperthyroidism including ablation therapy with radioactive iodine versus antithyroid drug treatment versus surgical therapy.  We discussed the various possible benefits versus side effects of the various therapies.   - I carefully explained to the patient that one of the consequences of I-131 ablation treatment would likely be permanent hypothyroidism which would require long-term replacement therapy with LT4.  - Will proceed with thyroid uptake and scan with I-131 for better penetration of the sternum     2. MNG/ substernal extension :   - Pt with slight local neck symptoms of occasional dysphagia to certain foods and SOB  - She had a benign FNA of the right thyroid nodule  - Unable to access the left retrosternal mass, will proceed with NM imaging to confirm thyroid tissue or not , would recommend further evaluation by cardiothoracic surgery if not consistent with thyroid tissue.    Addendum: Spoke to Dr. Thornton Papas to get his opion about the case and how to proceed and we felt that Thyroid uptake and scan with I-131 would be a good non-invasive option to assess thyroid as well as mediastinal mass.   3. Elevated Alkaline Phosphatase:   -  This is stable since 2019. Suspect increase bone resorption, will consider Alk Phos. Isozymes in the future.   F/U in 4 months     Signed electronically by: Mack Guise, MD  [facility] Endocrinology  [facility] [address] [place], [place] [address] Phone: [phone] FAX: [phone]   CC: Debbrah Alar, [address] [place] [address] Phone: [phone] Fax: [phone]   Return to Endocrinology clinic as below: Future Appointments  Date Time Provider [place]  03/13/2021  9:00 AM Debbrah Alar, NP [facility] [place]  03/19/2021  2:00 PM Debbrah Alar, NP [facility]

## 2020-11-21 DIAGNOSIS — R748 Abnormal levels of other serum enzymes: Secondary | ICD-10-CM | POA: Insufficient documentation

## 2020-11-21 HISTORY — DX: Abnormal levels of other serum enzymes: R74.8

## 2020-11-21 LAB — COMPREHENSIVE METABOLIC PANEL
ALT: 14 U/L (ref 0–35)
AST: 18 U/L (ref 0–37)
Albumin: 4.8 g/dL (ref 3.5–5.2)
Alkaline Phosphatase: 123 U/L — ABNORMAL HIGH (ref 39–117)
BUN: 11 mg/dL (ref 6–23)
CO2: 29 mEq/L (ref 19–32)
Calcium: 9.9 mg/dL (ref 8.4–10.5)
Chloride: 107 mEq/L (ref 96–112)
Creatinine, Ser: 0.91 mg/dL (ref 0.40–1.20)
GFR: 70.76 mL/min (ref >60.00)
Glucose, Bld: 89 mg/dL (ref 70–99)
Potassium: 4.6 mEq/L (ref 3.5–5.1)
Sodium: 142 mEq/L (ref 135–145)
Total Bilirubin: 0.3 mg/dL (ref 0.2–1.2)
Total Protein: 7 g/dL (ref 6.0–8.3)

## 2020-11-21 LAB — CBC WITH DIFFERENTIAL/PLATELET
Basophils Absolute: 0.1 10*3/uL (ref 0.0–0.1)
Basophils Relative: 0.8 % (ref 0.0–3.0)
Eosinophils Absolute: 0.2 10*3/uL (ref 0.0–0.7)
Eosinophils Relative: 2.8 % (ref 0.0–5.0)
HCT: 42.8 % (ref 36.0–46.0)
Hemoglobin: 14.3 g/dL (ref 12.0–15.0)
Lymphocytes Relative: 27.3 % (ref 12.0–46.0)
Lymphs Abs: 1.7 10*3/uL (ref 0.7–4.0)
MCHC: 33.4 g/dL (ref 30.0–36.0)
MCV: 90.8 fl (ref 78.0–100.0)
Monocytes Absolute: 0.4 10*3/uL (ref 0.1–1.0)
Monocytes Relative: 6.9 % (ref 3.0–12.0)
Neutro Abs: 3.9 10*3/uL (ref 1.4–7.7)
Neutrophils Relative %: 62.2 % (ref 43.0–77.0)
Platelets: 294 10*3/uL (ref 150.0–400.0)
RBC: 4.71 Mil/uL (ref 3.87–5.11)
RDW: 13.5 % (ref 11.5–15.5)
WBC: 6.2 10*3/uL (ref 4.0–10.5)

## 2020-11-21 LAB — T4, FREE: Free T4: 0.77 ng/dL (ref 0.60–1.60)

## 2020-11-21 LAB — TSH: TSH: 0.14 u[IU]/mL — ABNORMAL LOW (ref 0.35–4.50)

## 2020-11-24 LAB — TRAB (TSH RECEPTOR BINDING ANTIBODY): TRAB: <1 IU/L (ref <=2.00)

## 2020-11-24 LAB — T3: T3, Total: 117 ng/dL (ref 76–181)

## 2020-11-29 ENCOUNTER — Encounter (HOSPITAL_COMMUNITY)
Admission: RE | Admit: 2020-11-29 | Discharge: 2020-11-29 | Disposition: A | Discharge status: Home / Self Care | Payer: BC Managed Care – PPO | Source: Ambulatory Visit | Attending: Internal Medicine | Admitting: Internal Medicine

## 2020-11-29 ENCOUNTER — Other Ambulatory Visit: Payer: Self-pay

## 2020-11-29 DIAGNOSIS — E0590 Thyrotoxicosis, unspecified without thyrotoxic crisis or storm: Secondary | ICD-10-CM | POA: Insufficient documentation

## 2020-11-29 DIAGNOSIS — E042 Nontoxic multinodular goiter: Secondary | ICD-10-CM | POA: Diagnosis not present

## 2020-11-29 MED ORDER — SODIUM IODIDE I 131 CAPSULE
8.5000 | Freq: Once | INTRAVENOUS | Status: AC | PRN
  Administered 2020-11-29: 8.5 via ORAL

## 2020-11-30 ENCOUNTER — Encounter (HOSPITAL_COMMUNITY)
Admission: RE | Admit: 2020-11-30 | Discharge: 2020-11-30 | Disposition: A | Discharge status: Home / Self Care | Payer: BC Managed Care – PPO | Source: Ambulatory Visit | Attending: Internal Medicine | Admitting: Internal Medicine

## 2020-11-30 ENCOUNTER — Other Ambulatory Visit: Payer: Self-pay

## 2020-11-30 DIAGNOSIS — E041 Nontoxic single thyroid nodule: Secondary | ICD-10-CM | POA: Diagnosis not present

## 2020-11-30 MED ORDER — SODIUM PERTECHNETATE TC 99M INJECTION
4.2000 | Freq: Once | INTRAVENOUS | Status: AC | PRN
  Administered 2020-11-30: 4.2 via INTRAVENOUS

## 2020-12-03 ENCOUNTER — Other Ambulatory Visit: Payer: Self-pay | Admitting: Internal Medicine

## 2020-12-03 DIAGNOSIS — J9859 Other diseases of mediastinum, not elsewhere classified: Secondary | ICD-10-CM

## 2020-12-11 ENCOUNTER — Telehealth: Payer: Self-pay | Admitting: Internal Medicine

## 2020-12-11 NOTE — Telephone Encounter (Signed)
Spoke to the pt on 12/11/2020 and discussed cardiothoracic surgeon's note.   At this time we have not made solid plans as far as proceeding with sx    In her 20's she was diagnosed with prolactinoma, She is S/P tranphenoidal sx and I have discussed with her that a low TSh could be the results of pituitary surgery rather then hyperthyroid status but this is difficult to confirm. Will proceed with pituitary testing on next visit    Pt to schedule a follow up in 02/2021   Mack Guise, MD  [facility] Endocrinology  [facility] [address] [place], [place] [address] Phone: [phone] FAX: [phone]

## 2020-12-11 NOTE — Telephone Encounter (Signed)
-----   Message from Ned Clines sent at 12/10/2020  3:43 PM EST ----- Dr Roxan Hockey reviewed the disk/images for this patient and advised that this referral was for a thyroid goiter and should be referred to Dr Harlow Asa at [facility].  Thank You  Jenny Reichmann TCTS [phone]

## 2021-01-14 DIAGNOSIS — J309 Allergic rhinitis, unspecified: Secondary | ICD-10-CM | POA: Insufficient documentation

## 2021-01-14 DIAGNOSIS — Z87898 Personal history of other specified conditions: Secondary | ICD-10-CM | POA: Insufficient documentation

## 2021-01-14 DIAGNOSIS — I10 Essential (primary) hypertension: Secondary | ICD-10-CM | POA: Insufficient documentation

## 2021-01-14 DIAGNOSIS — N882 Stricture and stenosis of cervix uteri: Secondary | ICD-10-CM | POA: Insufficient documentation

## 2021-01-14 DIAGNOSIS — N950 Postmenopausal bleeding: Secondary | ICD-10-CM | POA: Insufficient documentation

## 2021-01-14 DIAGNOSIS — G43909 Migraine, unspecified, not intractable, without status migrainosus: Secondary | ICD-10-CM | POA: Insufficient documentation

## 2021-01-15 ENCOUNTER — Ambulatory Visit: Payer: BC Managed Care – PPO | Admitting: Cardiology

## 2021-01-15 ENCOUNTER — Encounter: Payer: Self-pay | Admitting: Cardiology

## 2021-01-15 ENCOUNTER — Encounter: Payer: Self-pay | Admitting: Family

## 2021-01-15 ENCOUNTER — Other Ambulatory Visit: Payer: Self-pay

## 2021-01-15 VITALS — BP 146/80 | HR 68 | Ht 62.6 in | Wt 147.1 lb

## 2021-01-15 DIAGNOSIS — I10 Essential (primary) hypertension: Secondary | ICD-10-CM

## 2021-01-15 DIAGNOSIS — J9859 Other diseases of mediastinum, not elsewhere classified: Secondary | ICD-10-CM

## 2021-01-15 HISTORY — DX: Other diseases of mediastinum, not elsewhere classified: J98.59

## 2021-01-15 MED ORDER — AMLODIPINE BESYLATE 10 MG PO TABS: 10.0000 mg | ORAL_TABLET | Freq: Every day | ORAL | 3 refills | Status: DC

## 2021-01-15 MED ORDER — METOPROLOL SUCCINATE ER 50 MG PO TB24: 50.0000 mg | ORAL_TABLET | Freq: Every day | ORAL | 3 refills | Status: DC

## 2021-01-15 MED ORDER — HYDROCHLOROTHIAZIDE 25 MG PO TABS: 25.0000 mg | ORAL_TABLET | Freq: Every day | ORAL | 3 refills | Status: DC

## 2021-01-15 NOTE — Progress Notes (Signed)
Cardiology Office Note:    Date:  01/15/2021   ID:  Gloria Bass, DOB 26-Dec-1965, MRN [id number]  PCP:  Debbrah Alar, NP  Cardiologist:  Jenean Lindau, MD   Referring MD: Debbrah Alar, NP    ASSESSMENT:    1. Essential hypertension   2. Mediastinal mass    PLAN:    In order of problems listed above:  1. Primary prevention stressed with patient.  Importance of compliance with diet medication stressed and she vocalized understanding.  I told her to walk at least half an hour a day on a daily basis and she promises to do so. 2. Essential hypertension: Blood pressure stable and diet was emphasized.  She is already aware of salt intake issues. 3. Mixed dyslipidemia: Is watchful of her diet and exercise.  She watches her diet.  Low in fatty food. 4. Mediastinal mass thyroid issues: Followed by endocrinologist and primary care.  I reviewed the blood work report. 5. Patient will be seen in follow-up appointment in 6 months or earlier if the patient has any concerns    Medication Adjustments/Labs and Tests Ordered: Current medicines are reviewed at length with the patient today.  Concerns regarding medicines are outlined above.  Orders Placed This Encounter  Procedures  . EKG 12-Lead   No orders of the defined types were placed in this encounter.    No chief complaint on file.    History of Present Illness:    Gloria Bass is a 55 y.o. female.  Patient has past medical history of essential hypertension.  She has an issue with thyroid and mediastinal mass which are related.  She is seeking the treatment of her primary care and endocrinologist for this.  She denies any chest pain orthopnea or PND.  She leads a sedentary lifestyle.  She has essential hypertension and watches her salt in the diet.  Most of the times when she is at home she mentions to me that she cooks her food so she has control on what is going into her body.  At the time of my evaluation,  the patient is alert awake oriented and in no distress.  Past Medical History:  Diagnosis Date  . Abnormal cervical Pap smear with positive HPV DNA test 09/20/2014  . Abnormal respiratory rate 05/12/2008   Formatting of this note might be different from the original. Overview:  Qualifier: Diagnosis of  By: Melvyn Novas MD, Christena Deem  . Acute bronchitis due to other specified organisms 12/30/2016  . Adjustment disorder 09/20/2014  . Allergic rhinitis   . Angioedema of lips 09/30/2017  . Anxiety 09/20/2014  . Cervical stenosis (uterine cervix)   . Chest pain 11/09/2017  . Colon cancer screening 04/22/2017  . Displaced fracture of distal phalanx of lesser toe of left foot 09/20/2014  . DYSPNEA 05/12/2008   Qualifier: Diagnosis of  By: Melvyn Novas MD, Christena Deem   . Elevated alkaline phosphatase level 11/21/2020  . Endometrial polyp 06/03/2018  . Essential hypertension 03/23/2017  . History of pituitary tumor    prolactinoma  . Hypertension   . Hyperthyroidism 05/16/2008   Formatting of this note might be different from the original. Overview:  Qualifier: Diagnosis of  By: Tilden Dome  . IBS (irritable bowel syndrome) 09/20/2014  . Localized edema 09/15/2017  . Migraines   . Mild intermittent asthma with acute exacerbation 12/30/2016  . Multinodular goiter 11/20/2020  . Other headache syndrome 09/07/2014  . Overweight 03/23/2017  . Pituitary adenoma ([place]) 09/07/2014  .  PMB (postmenopausal bleeding)   . Serotonin syndrome 03/23/2017  . Subclinical hyperthyroidism 11/20/2020  . Subclinical hypothyroidism 05/16/2008   Qualifier: Diagnosis of  By: Tilden Dome    . Symptomatic menopausal or female climacteric states 09/20/2014  . Thyrotoxicosis 05/16/2008   Formatting of this note might be different from the original. Overview:  Qualifier: Diagnosis of  By: Tilden Dome  . Toe pain 09/20/2014    Past Surgical History:  Procedure Laterality Date  . COLONOSCOPY WITH PROPOFOL  2018  . DOBUTAMINE STRESS ECHO   11-30-2017   dr Geraldo Pitter   normal withou evidence ischemia/  normal LVEF  . HYSTEROSCOPY WITH D & C N/A 06/03/2018   Procedure: DILATATION AND CURETTAGE /HYSTEROSCOPY;  Surgeon: Arvella Nigh, MD;  Location: [facility];  Service: Gynecology;  Laterality: N/A;  . TRANSPHENOIDAL PITUITARY RESECTION  age 22  @ [facility]   benign    Current Medications: Current Meds  Medication Sig  . amLODipine (NORVASC) 10 MG tablet Take 10 mg by mouth daily.  . Cholecalciferol (VITAMIN D) 50 MCG (2000 UT) tablet Take 2,000 Units by mouth daily.  . hydrochlorothiazide (HYDRODIURIL) 25 MG tablet Take 25 mg by mouth daily.  . metoprolol succinate (TOPROL-XL) 50 MG 24 hr tablet Take 50 mg by mouth daily.  . SUMAtriptan (IMITREX) 100 MG tablet Take 50-100 mg by mouth every 2 (two) hours as needed for migraine.      Allergies:   Lisinopril   Social History   Socioeconomic History  . Marital status: Legally Separated    Spouse name: Not on file  . Number of children: 1  . Years of education: Not on file  . Highest education level: Not on file  Occupational History  . Occupation: Government social research officer  Tobacco Use  . Smoking status: Never Smoker  . Smokeless tobacco: Never Used  Vaping Use  . Vaping Use: Never used  Substance and Sexual Activity  . Alcohol use: Yes    Comment: occasional  . Drug use: No  . Sexual activity: Not Currently    Birth control/protection: Post-menopausal  Other Topics Concern  . Not on file  Social History Narrative   Works as a Government social research officer for [employer]   Single   Has son (adult)-  Lives in [place]   Enjoys making jewelry   Spending time with her niece/nephews   No pets   Social Determinants of Radio broadcast assistant Strain: Not on file  Food Insecurity: Not on file  Transportation Needs: Not on file  Physical Activity: Not on file  Stress: Not on file  Social Connections: Not on file     Family History: The  patient's family history includes Hypertension in her father and mother.  ROS:   Please see the history of present illness.    All other systems reviewed and are negative.  EKGs/Labs/Other Studies Reviewed:    The following studies were reviewed today: EKG reveals sinus rhythm and nonspecific ST-T changes   Recent Labs: 11/20/2020: ALT 14; BUN 11; Creatinine, Ser 0.91; Hemoglobin 14.3; Platelets 294.0; Potassium 4.6; Sodium 142; TSH 0.14  Recent Lipid Panel    Component Value Date/Time   CHOL 176 10/26/2019 1058   TRIG 151 (H) 10/26/2019 1058   HDL 57 10/26/2019 1058   CHOLHDL 3.1 10/26/2019 1058   LDLCALC 93 10/26/2019 1058    Physical Exam:    VS:  BP (!) 146/80   Pulse 68  Ht 5' 2.6" (1.59 m)   Wt 147 lb 1.3 oz (66.7 kg)   SpO2 98%   BMI 26.39 kg/m     Wt Readings from Last 3 Encounters:  01/15/21 147 lb 1.3 oz (66.7 kg)  11/20/20 150 lb 4 oz (68.2 kg)  09/07/20 150 lb (68 kg)     GEN: Patient is in no acute distress HEENT: Normal NECK: No JVD; No carotid bruits LYMPHATICS: No lymphadenopathy CARDIAC: Hear sounds regular, 2/6 systolic murmur at the apex. RESPIRATORY:  Clear to auscultation without rales, wheezing or rhonchi  ABDOMEN: Soft, non-tender, non-distended MUSCULOSKELETAL:  No edema; No deformity  SKIN: Warm and dry NEUROLOGIC:  Alert and oriented x 3 PSYCHIATRIC:  Normal affect   Signed, Jenean Lindau, MD  01/15/2021 4:10 PM    [facility]

## 2021-01-15 NOTE — Addendum Note (Signed)
Addended by: Truddie Hidden on: 01/15/2021 04:32 PM   Modules accepted: Orders

## 2021-01-15 NOTE — Patient Instructions (Signed)

## 2021-02-18 DIAGNOSIS — Z6827 Body mass index (BMI) 27.0-27.9, adult: Secondary | ICD-10-CM | POA: Diagnosis not present

## 2021-02-18 DIAGNOSIS — Z01419 Encounter for gynecological examination (general) (routine) without abnormal findings: Secondary | ICD-10-CM | POA: Diagnosis not present

## 2021-02-18 LAB — HM PAP SMEAR

## 2021-03-11 DIAGNOSIS — Z801 Family history of malignant neoplasm of trachea, bronchus and lung: Secondary | ICD-10-CM | POA: Diagnosis not present

## 2021-03-11 DIAGNOSIS — R87612 Low grade squamous intraepithelial lesion on cytologic smear of cervix (LGSIL): Secondary | ICD-10-CM | POA: Diagnosis not present

## 2021-03-11 DIAGNOSIS — R87810 Cervical high risk human papillomavirus (HPV) DNA test positive: Secondary | ICD-10-CM | POA: Diagnosis not present

## 2021-03-11 DIAGNOSIS — Z803 Family history of malignant neoplasm of breast: Secondary | ICD-10-CM | POA: Diagnosis not present

## 2021-03-13 ENCOUNTER — Telehealth: Payer: Self-pay | Admitting: Family

## 2021-03-13 ENCOUNTER — Ambulatory Visit (INDEPENDENT_AMBULATORY_CARE_PROVIDER_SITE_OTHER): Payer: BC Managed Care – PPO | Admitting: Family

## 2021-03-13 ENCOUNTER — Encounter: Payer: Self-pay | Admitting: Family

## 2021-03-13 ENCOUNTER — Other Ambulatory Visit: Payer: Self-pay

## 2021-03-13 VITALS — BP 127/77 | HR 59 | Temp 98.3°F | Resp 16 | Ht 62.6 in | Wt 152.0 lb

## 2021-03-13 DIAGNOSIS — E781 Pure hyperglyceridemia: Secondary | ICD-10-CM

## 2021-03-13 DIAGNOSIS — E559 Vitamin D deficiency, unspecified: Secondary | ICD-10-CM

## 2021-03-13 DIAGNOSIS — Z0000 Encounter for general adult medical examination without abnormal findings: Secondary | ICD-10-CM

## 2021-03-13 DIAGNOSIS — Z23 Encounter for immunization: Secondary | ICD-10-CM | POA: Diagnosis not present

## 2021-03-13 DIAGNOSIS — Z1231 Encounter for screening mammogram for malignant neoplasm of breast: Secondary | ICD-10-CM | POA: Insufficient documentation

## 2021-03-13 HISTORY — DX: Encounter for general adult medical examination without abnormal findings: Z00.00

## 2021-03-13 LAB — LIPID PANEL
Cholesterol: 178 mg/dL (ref 0–200)
HDL: 53.1 mg/dL (ref >39.00)
LDL Cholesterol: 111 mg/dL — ABNORMAL HIGH (ref 0–99)
NonHDL: 124.97
Total CHOL/HDL Ratio: 3
Triglycerides: 71 mg/dL (ref 0.0–149.0)
VLDL: 14.2 mg/dL (ref 0.0–40.0)

## 2021-03-13 LAB — VITAMIN D 25 HYDROXY (VIT D DEFICIENCY, FRACTURES): VITD: 59.64 ng/mL (ref 30.00–100.00)

## 2021-03-13 MED ORDER — SUMATRIPTAN SUCCINATE 100 MG PO TABS: 50.0000 mg | ORAL_TABLET | ORAL | 5 refills | Status: DC | PRN

## 2021-03-13 NOTE — Patient Instructions (Signed)
Please complete lab work prior to leaving.   

## 2021-03-13 NOTE — Telephone Encounter (Signed)
Records release [facility] to [facility] 

## 2021-03-13 NOTE — Addendum Note (Signed)
Addended by: Jiles Prows on: 03/13/2021 12:08 PM   Modules accepted: Orders

## 2021-03-13 NOTE — Progress Notes (Signed)
Subjective:   By signing my name below, I, Shehryar Baig, attest that this documentation has been prepared under the direction and in the presence of Debbrah Alar NP. 03/13/2021    Patient ID: Gloria Bass, female    DOB: 25-Nov-1965, 55 y.o.   MRN: [id number]  No chief complaint on file.   HPI Patient is in today for a comprehensive physical exam.  She complains of issues with her breathing. They found a mass on her thyroid from her CT scans. Her other providers wanted to wait and observe the mass before managing it. She denies having any cough, cold symptoms, burning, frequency, skin changes, muscle pain at this time. She has had no changes to her family medical history recently. She occasionally drinks alcohol, does not smoke, and does not use drugs.  Constipation- She complains of constipation recently. She has taken metamucil to manage these symptoms but has found no relief.  Hypertension- She is taking 50 mg metoprolol succinate daily PO, 10 mg amlodipine daily PO, and 25 mg hydrochlorothiazide daily PO to manage her hypertension, Migraine- She has not had any recent episodes of migraines. She has stopped taking 100 mg Imitrex.  Allergy- Her allergies are worse this season compared to her previous years. She takes zyrtec to manage her allergies and finds mild relief. She does not use nasal spray because of odd sensation from using it.  Immunizations: She has had 3 pfizer Covid 19 vaccinations. She is not UTD at tetanus vaccine and is willing to get it at a later date after her initial shingles vaccine. She is getting her shingles vaccination today, 03/13/2021. Diet: She is eating a healthy diet. Exercise: She participates in light exercise by walking. Colonoscopy: Last completed 05/14/2017. Results showed polyps 3-4 mm in ascending colon, mild diverticulosis of the sigmoid colon, internal hemorrhoids, otherwise results are normal. Repeat in 5 years.   Dexa: Not yet  completed. Pap Smear: Last completed 03/11/2021.  Mammogram: Last completed 06/29/2020. Results normal. Repeat in 1 year.  Dental: She is UTD on dental exams. Vision: She has an upcoming appointment in jubm 2022.   Past Medical History:  Diagnosis Date  . Abnormal cervical Pap smear with positive HPV DNA test 09/20/2014  . Abnormal respiratory rate 05/12/2008   Formatting of this note might be different from the original. Overview:  Qualifier: Diagnosis of  By: Melvyn Novas MD, Christena Deem  . Acute bronchitis due to other specified organisms 12/30/2016  . Adjustment disorder 09/20/2014  . Allergic rhinitis   . Angioedema of lips 09/30/2017  . Anxiety 09/20/2014  . Cervical stenosis (uterine cervix)   . Chest pain 11/09/2017  . Colon cancer screening 04/22/2017  . Displaced fracture of distal phalanx of lesser toe of left foot 09/20/2014  . DYSPNEA 05/12/2008   Qualifier: Diagnosis of  By: Melvyn Novas MD, Christena Deem   . Elevated alkaline phosphatase level 11/21/2020  . Endometrial polyp 06/03/2018  . Essential hypertension 03/23/2017  . History of pituitary tumor    prolactinoma  . Hypertension   . Hyperthyroidism 05/16/2008   Formatting of this note might be different from the original. Overview:  Qualifier: Diagnosis of  By: Tilden Dome  . IBS (irritable bowel syndrome) 09/20/2014  . Localized edema 09/15/2017  . Migraines   . Mild intermittent asthma with acute exacerbation 12/30/2016  . Multinodular goiter 11/20/2020  . Other headache syndrome 09/07/2014  . Overweight 03/23/2017  . Pituitary adenoma ([place]) 09/07/2014  . PMB (postmenopausal bleeding)   .  Serotonin syndrome 03/23/2017  . Subclinical hyperthyroidism 11/20/2020  . Subclinical hypothyroidism 05/16/2008   Qualifier: Diagnosis of  By: Tilden Dome    . Symptomatic menopausal or female climacteric states 09/20/2014  . Thyrotoxicosis 05/16/2008   Formatting of this note might be different from the original. Overview:  Qualifier: Diagnosis of   By: Tilden Dome  . Toe pain 09/20/2014    Past Surgical History:  Procedure Laterality Date  . COLONOSCOPY WITH PROPOFOL  2018  . DOBUTAMINE STRESS ECHO  11-30-2017   dr Geraldo Pitter   normal withou evidence ischemia/  normal LVEF  . HYSTEROSCOPY WITH D & C N/A 06/03/2018   Procedure: DILATATION AND CURETTAGE /HYSTEROSCOPY;  Surgeon: Arvella Nigh, MD;  Location: [facility];  Service: Gynecology;  Laterality: N/A;  . TRANSPHENOIDAL PITUITARY RESECTION  age 53  @ [facility]   benign    Family History  Problem Relation Age of Onset  . Hypertension Mother   . Hypertension Father     Social History   Socioeconomic History  . Marital status: Legally Separated    Spouse name: Not on file  . Number of children: 1  . Years of education: Not on file  . Highest education level: Not on file  Occupational History  . Occupation: Government social research officer  Tobacco Use  . Smoking status: Never Smoker  . Smokeless tobacco: Never Used  Vaping Use  . Vaping Use: Never used  Substance and Sexual Activity  . Alcohol use: Yes    Comment: occasional  . Drug use: No  . Sexual activity: Not Currently    Birth control/protection: Post-menopausal  Other Topics Concern  . Not on file  Social History Narrative   Works as a Government social research officer for [employer]   Single   Has son (adult)-  Lives in [place]   Enjoys making jewelry   Spending time with her niece/nephews   No pets   Social Determinants of Radio broadcast assistant Strain: Not on file  Food Insecurity: Not on file  Transportation Needs: Not on file  Physical Activity: Not on file  Stress: Not on file  Social Connections: Not on file  Intimate Partner Violence: Not on file    Outpatient Medications Prior to Visit  Medication Sig Dispense Refill  . amLODipine (NORVASC) 10 MG tablet Take 1 tablet (10 mg total) by mouth daily. 90 tablet 3  . Cholecalciferol (VITAMIN D) 50 MCG (2000 UT) tablet Take  2,000 Units by mouth daily.    Marland Kitchen COVID-19 mRNA vaccine, Pfizer, 30 MCG/0.3ML injection INJECT AS DIRECTED .3 mL 0  . hydrochlorothiazide (HYDRODIURIL) 25 MG tablet Take 1 tablet (25 mg total) by mouth daily. 90 tablet 3  . metoprolol succinate (TOPROL-XL) 50 MG 24 hr tablet Take 1 tablet (50 mg total) by mouth daily. 90 tablet 3  . SUMAtriptan (IMITREX) 100 MG tablet Take 50-100 mg by mouth every 2 (two) hours as needed for migraine.      No facility-administered medications prior to visit.    Allergies  Allergen Reactions  . Lisinopril Swelling    Angioedema     Review of Systems  HENT: Negative for congestion.   Respiratory: Negative for cough.   Gastrointestinal: Positive for constipation.  Genitourinary: Negative for dysuria and frequency.  Musculoskeletal: Negative for myalgias.  Skin:       (-)Skin changes  Neurological: Negative for headaches.       Objective:    Physical Exam  Constitutional:      Appearance: Normal appearance.  HENT:     Head: Normocephalic and atraumatic.     Right Ear: Tympanic membrane and external ear normal.     Left Ear: Tympanic membrane and external ear normal.  Eyes:     Extraocular Movements: Extraocular movements intact.     Pupils: Pupils are equal, round, and reactive to light.     Comments: No nystagmus  Neck:     Thyroid: Thyromegaly (mild, R>L) present.  Cardiovascular:     Rate and Rhythm: Normal rate and regular rhythm.     Pulses: Normal pulses.     Heart sounds: Normal heart sounds. No murmur heard. No friction rub. No gallop.   Pulmonary:     Effort: Pulmonary effort is normal. No respiratory distress.     Breath sounds: Normal breath sounds. No stridor. No wheezing, rhonchi or rales.  Abdominal:     General: Bowel sounds are normal. There is no distension.     Palpations: Abdomen is soft. There is no mass.     Tenderness: There is no abdominal tenderness. There is no guarding or rebound.     Hernia: No hernia is  present.  Musculoskeletal:     Comments: 5/5 strength in both upper and lower extremities.  Lymphadenopathy:     Cervical: No cervical adenopathy.  Skin:    General: Skin is warm and dry.  Neurological:     Mental Status: She is alert and oriented to person, place, and time.     Comments: Normal patellar reflexes  Psychiatric:        Behavior: Behavior normal.     There were no vitals taken for this visit. Wt Readings from Last 3 Encounters:  01/15/21 147 lb 1.3 oz (66.7 kg)  11/20/20 150 lb 4 oz (68.2 kg)  09/07/20 150 lb (68 kg)    Diabetic Foot Exam - Simple   No data filed    Lab Results  Component Value Date   WBC 6.2 11/20/2020   HGB 14.3 11/20/2020   HCT 42.8 11/20/2020   PLT 294.0 11/20/2020   GLUCOSE 89 11/20/2020   CHOL 176 10/26/2019   TRIG 151 (H) 10/26/2019   HDL 57 10/26/2019   LDLCALC 93 10/26/2019   ALT 14 11/20/2020   AST 18 11/20/2020   NA 142 11/20/2020   K 4.6 11/20/2020   CL 107 11/20/2020   CREATININE 0.91 11/20/2020   BUN 11 11/20/2020   CO2 29 11/20/2020   TSH 0.14 (L) 11/20/2020   INR 0.9 04/03/2020    Lab Results  Component Value Date   TSH 0.14 (L) 11/20/2020   Lab Results  Component Value Date   WBC 6.2 11/20/2020   HGB 14.3 11/20/2020   HCT 42.8 11/20/2020   MCV 90.8 11/20/2020   PLT 294.0 11/20/2020   Lab Results  Component Value Date   NA 142 11/20/2020   K 4.6 11/20/2020   CO2 29 11/20/2020   GLUCOSE 89 11/20/2020   BUN 11 11/20/2020   CREATININE 0.91 11/20/2020   BILITOT 0.3 11/20/2020   ALKPHOS 123 (H) 11/20/2020   AST 18 11/20/2020   ALT 14 11/20/2020   PROT 7.0 11/20/2020   ALBUMIN 4.8 11/20/2020   CALCIUM 9.9 11/20/2020   ANIONGAP 7 06/01/2018   GFR 70.76 11/20/2020   Lab Results  Component Value Date   CHOL 176 10/26/2019   Lab Results  Component Value Date   HDL 57 10/26/2019  Lab Results  Component Value Date   LDLCALC 93 10/26/2019   Lab Results  Component Value Date   TRIG 151 (H)  10/26/2019   Lab Results  Component Value Date   CHOLHDL 3.1 10/26/2019   No results found for: HGBA1C     Assessment & Plan:   Problem List Items Addressed This Visit   None     No orders of the defined types were placed in this encounter.   I, Debbrah Alar NP, personally preformed the services described in this documentation.  All medical record entries made by the scribe were at my direction and in my presence.  I have reviewed the chart and discharge instructions (if applicable) and agree that the record reflects my personal performance and is accurate and complete. 03/13/2021   I,Shehryar Baig,acting as a Education administrator for Nance Pear, NP.,have documented all relevant documentation on the behalf of Nance Pear, NP,as directed by  Nance Pear, NP while in the presence of Nance Pear, NP.   Shehryar [employer]

## 2021-03-13 NOTE — Assessment & Plan Note (Addendum)
Dicussed healthy diet and exercise.  Pap, colo, mammo up to date. Shingrix #1 today. Declines tetanus today.

## 2021-03-13 NOTE — Telephone Encounter (Signed)
Please call [facility] and request pap smear.

## 2021-03-19 ENCOUNTER — Ambulatory Visit: Payer: BC Managed Care – PPO | Admitting: Family

## 2021-03-25 IMAGING — MG DIGITAL SCREENING BILATERAL MAMMOGRAM WITH TOMO AND CAD
8 series · 9 of 24 positions shown · non-contrast
Comparison: Previous exam(s).

CLINICAL DATA: Screening.

EXAM:
DIGITAL SCREENING BILATERAL MAMMOGRAM WITH TOMO AND CAD

[L CC synth-2D]
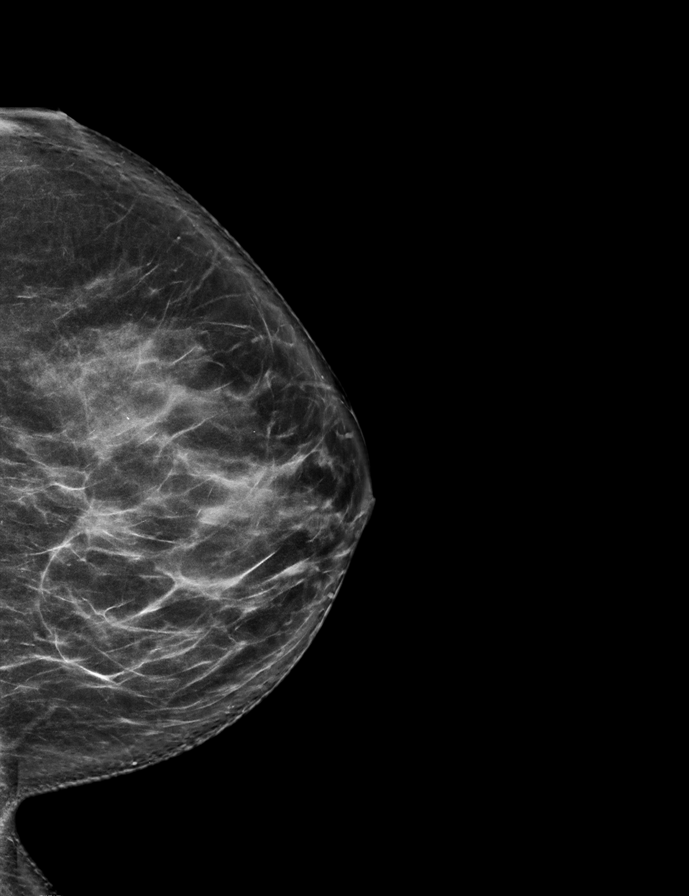

[L MLO synth-2D]
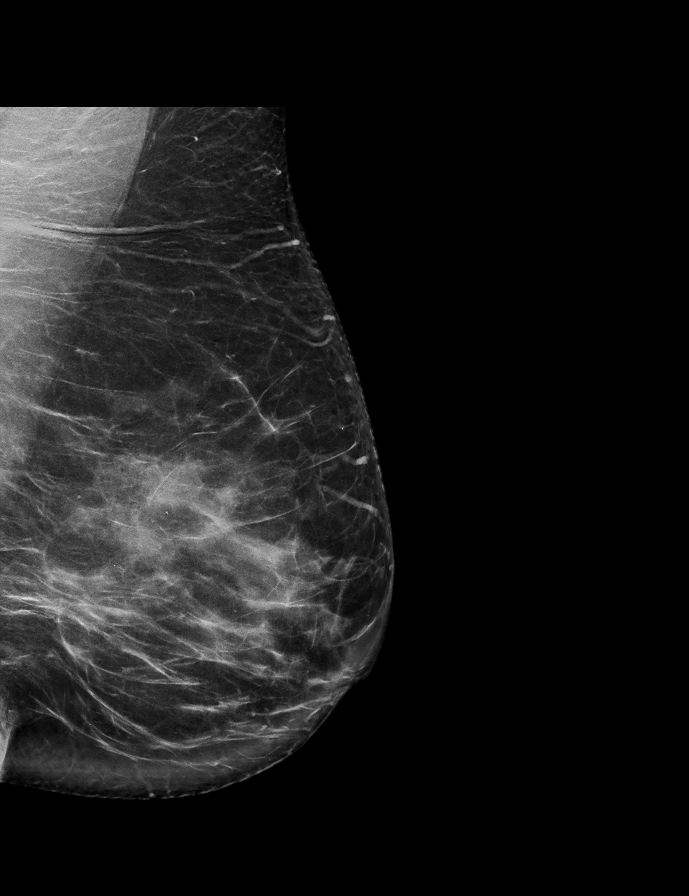

[R MLO synth-2D]
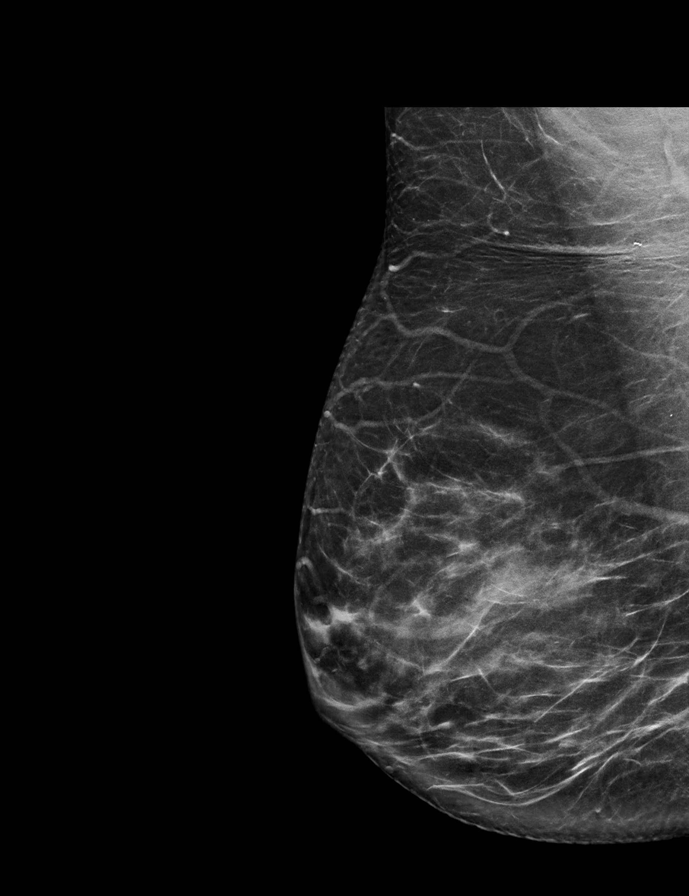

[R CC synth-2D]
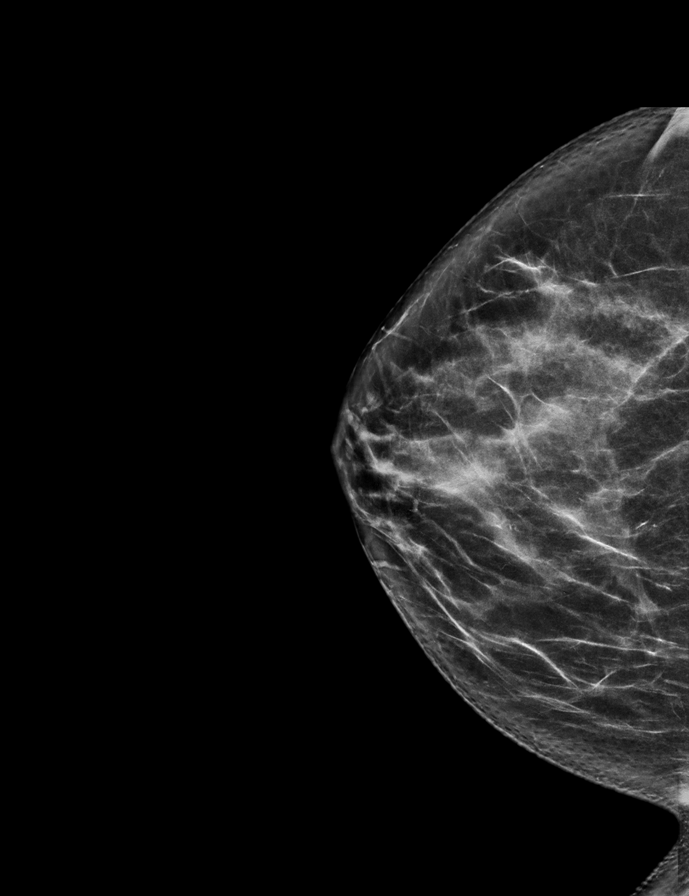

[L CC tomo · 2 of 72 frames shown]
[frame 24/72]
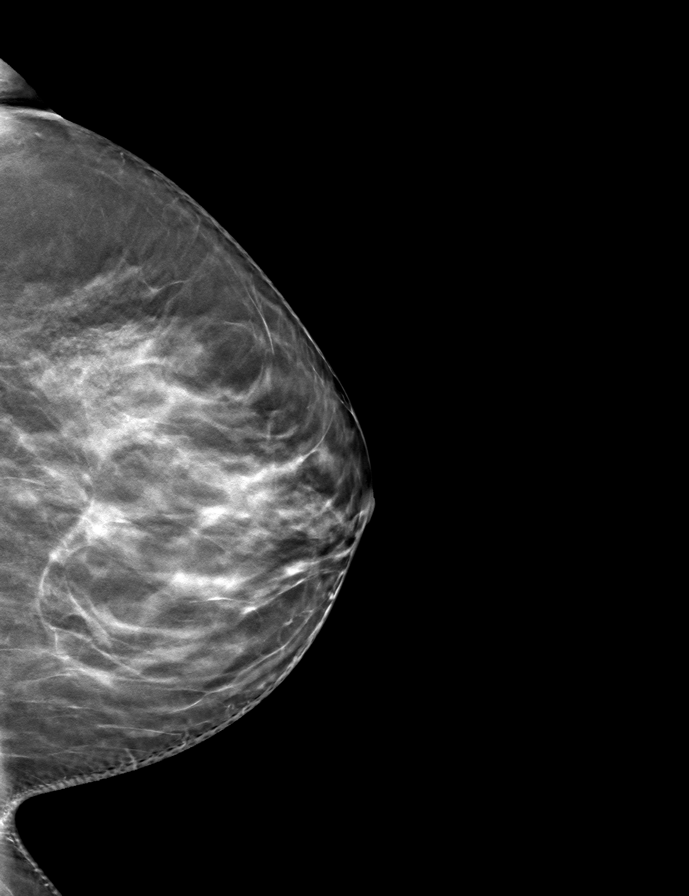
[frame 37/72]
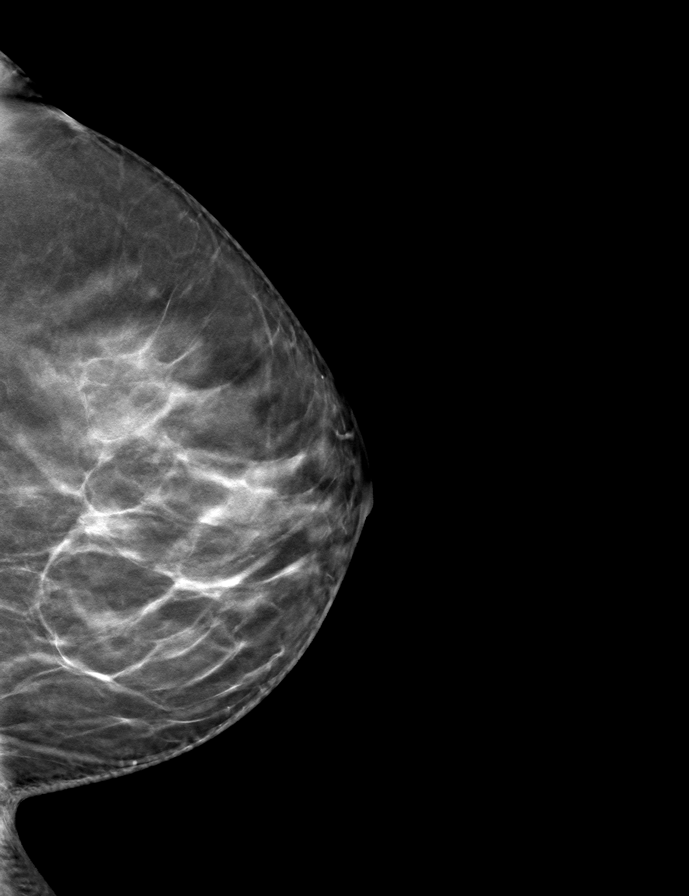

[R MLO tomo · tomo slice 37/72.0]
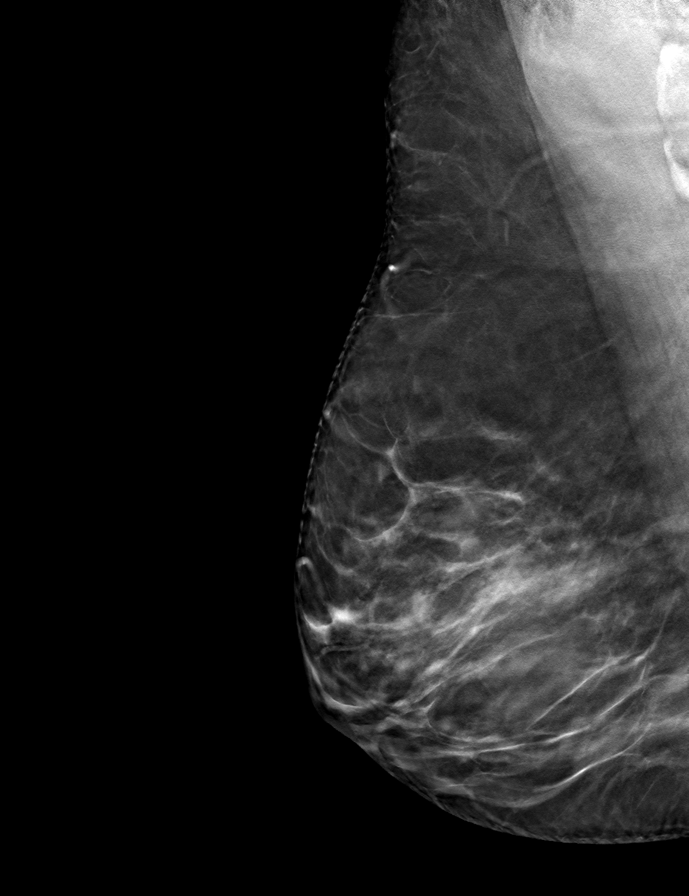

[L MLO tomo · tomo slice 40/79.0]
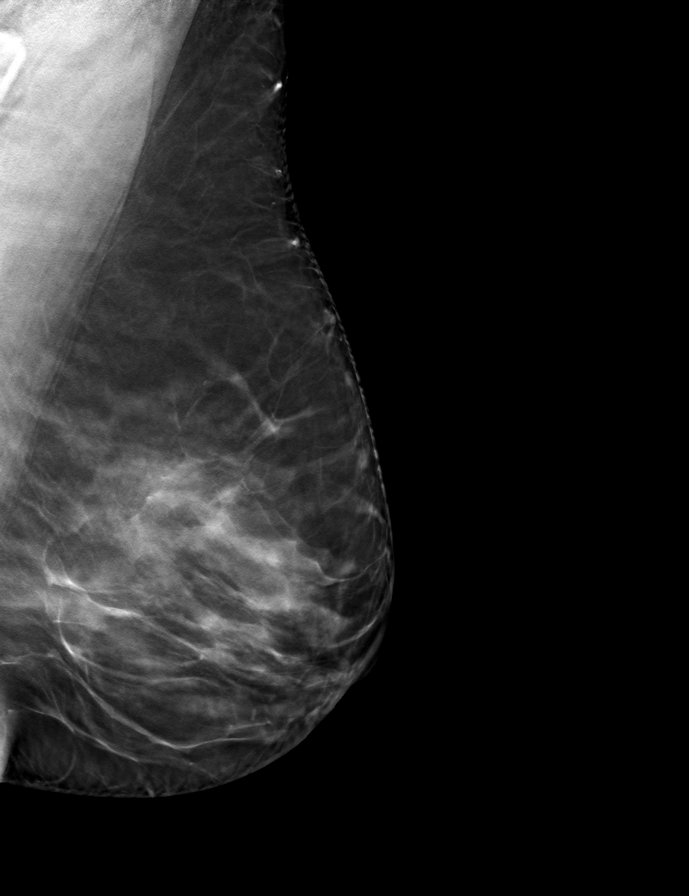

[R CC tomo · tomo slice 35/69.0]
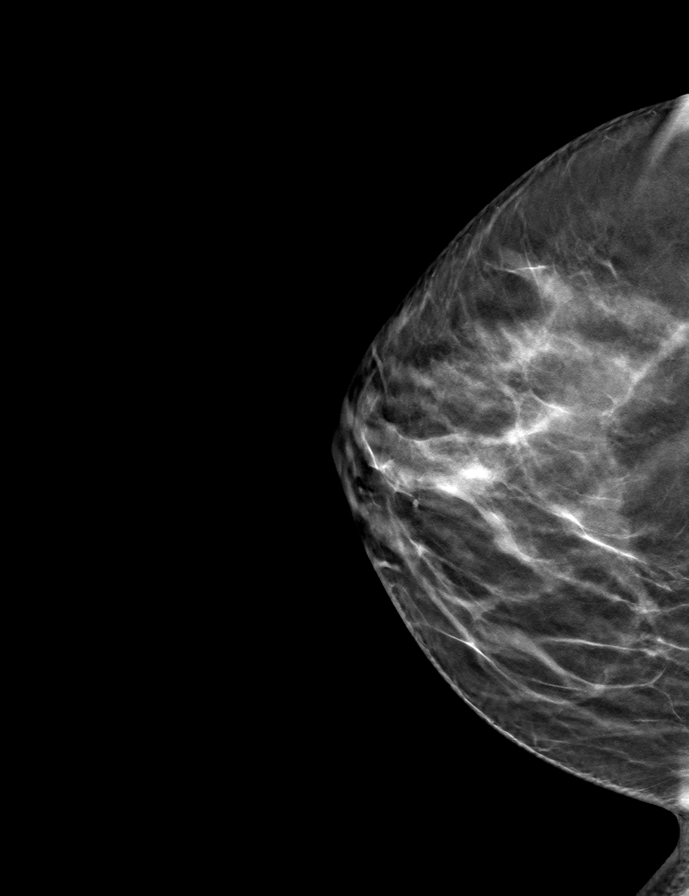

[9 of 24 positions shown; findings below may reference images not displayed]

ACR Breast Density Category c: The breast tissue is heterogeneously
dense, which may obscure small masses.
FINDINGS: There are no findings suspicious for malignancy. Images were
processed with CAD.
IMPRESSION: No mammographic evidence of malignancy. A result letter of this
screening mammogram will be mailed directly to the patient.

RECOMMENDATION:
Screening mammogram in one year. (Code:FT-U-LHB)

BI-RADS CATEGORY  1: Negative.

## 2021-04-02 DIAGNOSIS — Z13820 Encounter for screening for osteoporosis: Secondary | ICD-10-CM | POA: Diagnosis not present

## 2021-04-02 DIAGNOSIS — R140 Abdominal distension (gaseous): Secondary | ICD-10-CM | POA: Diagnosis not present

## 2021-04-11 ENCOUNTER — Encounter: Payer: Self-pay | Admitting: Family

## 2021-04-11 MED ORDER — LUBIPROSTONE 8 MCG PO CAPS: 8.0000 ug | ORAL_CAPSULE | Freq: Two times a day (BID) | ORAL | 1 refills | Status: DC

## 2021-06-13 ENCOUNTER — Other Ambulatory Visit: Payer: Self-pay | Admitting: Obstetrics and Gynecology

## 2021-06-13 DIAGNOSIS — Z1231 Encounter for screening mammogram for malignant neoplasm of breast: Secondary | ICD-10-CM

## 2021-06-20 ENCOUNTER — Ambulatory Visit: Payer: BC Managed Care – PPO

## 2021-06-28 ENCOUNTER — Ambulatory Visit: Payer: BC Managed Care – PPO

## 2021-07-03 ENCOUNTER — Other Ambulatory Visit: Payer: Self-pay

## 2021-07-03 ENCOUNTER — Ambulatory Visit
Admission: RE | Admit: 2021-07-03 | Discharge: 2021-07-03 | Disposition: A | Discharge status: Home / Self Care | Payer: BC Managed Care – PPO | Source: Ambulatory Visit | Attending: Obstetrics and Gynecology | Admitting: Obstetrics and Gynecology

## 2021-07-03 DIAGNOSIS — Z1231 Encounter for screening mammogram for malignant neoplasm of breast: Secondary | ICD-10-CM | POA: Diagnosis not present

## 2021-07-04 ENCOUNTER — Ambulatory Visit (INDEPENDENT_AMBULATORY_CARE_PROVIDER_SITE_OTHER): Payer: BC Managed Care – PPO

## 2021-07-04 DIAGNOSIS — Z23 Encounter for immunization: Secondary | ICD-10-CM

## 2021-07-04 NOTE — Progress Notes (Signed)
Patient here today for shingrix vaccine. 0.39m given in right deltoid IM. Patient tolerated well. VIS given. [facility] updated.

## 2021-07-18 ENCOUNTER — Ambulatory Visit: Payer: BC Managed Care – PPO | Admitting: Cardiology

## 2021-09-13 ENCOUNTER — Other Ambulatory Visit: Payer: Self-pay

## 2021-09-13 ENCOUNTER — Ambulatory Visit: Payer: BC Managed Care – PPO | Admitting: Family

## 2021-09-13 VITALS — BP 132/79 | HR 67 | Temp 98.6°F | Resp 16 | Wt 149.0 lb

## 2021-09-13 DIAGNOSIS — K581 Irritable bowel syndrome with constipation: Secondary | ICD-10-CM

## 2021-09-13 DIAGNOSIS — I10 Essential (primary) hypertension: Secondary | ICD-10-CM | POA: Diagnosis not present

## 2021-09-13 DIAGNOSIS — E0590 Thyrotoxicosis, unspecified without thyrotoxic crisis or storm: Secondary | ICD-10-CM

## 2021-09-13 LAB — LIPID PANEL
Cholesterol: 161 mg/dL (ref 0–200)
HDL: 47 mg/dL (ref >39.00)
LDL Cholesterol: 78 mg/dL (ref 0–99)
NonHDL: 113.81
Total CHOL/HDL Ratio: 3
Triglycerides: 178 mg/dL — ABNORMAL HIGH (ref 0.0–149.0)
VLDL: 35.6 mg/dL (ref 0.0–40.0)

## 2021-09-13 LAB — COMPREHENSIVE METABOLIC PANEL
ALT: 17 U/L (ref 0–35)
AST: 19 U/L (ref 0–37)
Albumin: 4.7 g/dL (ref 3.5–5.2)
Alkaline Phosphatase: 115 U/L (ref 39–117)
BUN: 9 mg/dL (ref 6–23)
CO2: 32 mEq/L (ref 19–32)
Calcium: 9.7 mg/dL (ref 8.4–10.5)
Chloride: 106 mEq/L (ref 96–112)
Creatinine, Ser: 0.82 mg/dL (ref 0.40–1.20)
GFR: 79.73 mL/min (ref >60.00)
Glucose, Bld: 84 mg/dL (ref 70–99)
Potassium: 3.9 mEq/L (ref 3.5–5.1)
Sodium: 145 mEq/L (ref 135–145)
Total Bilirubin: 0.3 mg/dL (ref 0.2–1.2)
Total Protein: 6.8 g/dL (ref 6.0–8.3)

## 2021-09-13 MED ORDER — LINACLOTIDE 290 MCG PO CAPS: 290.0000 ug | ORAL_CAPSULE | Freq: Every day | ORAL | 2 refills | Status: DC

## 2021-09-13 NOTE — Assessment & Plan Note (Signed)
BP stable despite not taking HCTZ. I advised pt ok to remain off of hctz.

## 2021-09-13 NOTE — Progress Notes (Signed)
Subjective:   By signing my name below, I, Lyric Barr-McArthur, attest that this documentation has been prepared under the direction and in the presence of Sandford Craze, NP, 09/13/2021   Patient ID: Gloria Bass, female    DOB: 1966-03-28, 55 y.o.   MRN: [id number]  Chief Complaint  Patient presents with   Hypertension    Here for follow up    HPI Patient is in today for an office visit.  Blood pressure: Her blood pressure is within normal range today. She is compliant in taking 10 mg amlodipine and 50 mg metoprolol. She is no longer taking 25 mg hydrochlorothiazide due to leg cramping at night.  BP Readings from Last 3 Encounters:  09/13/21 132/79  03/13/21 127/77  01/15/21 (!) 146/80  Irritable bowel syndrome: She notes that when she filled the 8 mcg Amitiza for her IBS symptoms she found no relief at all. She did not fill it again after this due to the high cost and no relief. She notes that she has been using OTC metamucil and other OTC stomach pain relievers. She mentions that she is still not happy with the current state of her IBS and would like to try another medication that may help.  Migraine: She reports that she has not had consistent migraines since she got her braces removed. She mentions that her other provider has her fasting in the mornings and on the second day of fasting she got her first migraine in years. She took a nap and woke up feeling better and has not reported anymore since.  Thyroid: She is seeing another provider in the office to help manage her overactive thyroid. They assessed that her overactive thyroid would best be treated with radioactive iodine injections but this has not taken place yet.  Immunizations: She denied the flu shot at this time. She has finished her Shingrix series and has received 3 Covid-19 vaccines. She is going to get the updated Covid-19 vaccine at a later date.   Health Maintenance Due  Topic Date Due   Pneumococcal  Vaccine 79-86 Years old (1 - PCV) Never done   COVID-19 Vaccine (4 - Booster for [employer] series) 11/02/2020    Past Medical History:  Diagnosis Date   Abnormal cervical Pap smear with positive HPV DNA test 09/20/2014   Abnormal respiratory rate 05/12/2008   Formatting of this note might be different from the original. Overview:  Qualifier: Diagnosis of  By: Sherene Sires MD, Casimiro Needle B   Acute bronchitis due to other specified organisms 12/30/2016   Adjustment disorder 09/20/2014   Allergic rhinitis    Angioedema of lips 09/30/2017   Anxiety 09/20/2014   Cervical stenosis (uterine cervix)    Chest pain 11/09/2017   Colon cancer screening 04/22/2017   Displaced fracture of distal phalanx of lesser toe of left foot 09/20/2014   DYSPNEA 05/12/2008   Qualifier: Diagnosis of  By: Sherene Sires MD, Casimiro Needle B    Elevated alkaline phosphatase level 11/21/2020   Endometrial polyp 06/03/2018   Essential hypertension 03/23/2017   History of pituitary tumor    prolactinoma   Hypertension    Hyperthyroidism 05/16/2008   Formatting of this note might be different from the original. Overview:  Qualifier: Diagnosis of  By: Vernie Murders   IBS (irritable bowel syndrome) 09/20/2014   Localized edema 09/15/2017   Migraines    Mild intermittent asthma with acute exacerbation 12/30/2016   Multinodular goiter 11/20/2020   Other headache syndrome 09/07/2014   Overweight 03/23/2017  Pituitary adenoma ([place]) 09/07/2014   PMB (postmenopausal bleeding)    Serotonin syndrome 03/23/2017   Subclinical hyperthyroidism 11/20/2020   Subclinical hypothyroidism 05/16/2008   Qualifier: Diagnosis of  By: Tilden Dome     Symptomatic menopausal or female climacteric states 09/20/2014   Thyrotoxicosis 05/16/2008   Formatting of this note might be different from the original. Overview:  Qualifier: Diagnosis of  By: Tilden Dome   Toe pain 09/20/2014    Past Surgical History:  Procedure Laterality Date   COLONOSCOPY WITH PROPOFOL  2018    DOBUTAMINE STRESS ECHO  11-30-2017   dr Geraldo Pitter   normal withou evidence ischemia/  normal LVEF   HYSTEROSCOPY WITH D & C N/A 06/03/2018   Procedure: DILATATION AND CURETTAGE /HYSTEROSCOPY;  Surgeon: Arvella Nigh, MD;  Location: [place];  Service: Gynecology;  Laterality: N/A;   TRANSPHENOIDAL PITUITARY RESECTION  age 73  @ [facility]   benign    Family History  Problem Relation Age of Onset   Hypertension Mother    Hypertension Father     Social History   Socioeconomic History   Marital status: Legally Separated    Spouse name: Not on file   Number of children: 1   Years of education: Not on file   Highest education level: Not on file  Occupational History   Occupation: Government social research officer  Tobacco Use   Smoking status: Never   Smokeless tobacco: Never  Vaping Use   Vaping Use: Never used  Substance and Sexual Activity   Alcohol use: Yes    Comment: occasional   Drug use: No   Sexual activity: Not Currently    Birth control/protection: Post-menopausal  Other Topics Concern   Not on file  Social History Narrative   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 90 tablet 3   Cholecalciferol (VITAMIN D) 50 MCG (2000 UT) tablet Take 2,000 Units by mouth daily.     metoprolol succinate (TOPROL-XL) 50 MG 24 hr tablet Take 1 tablet (50 mg total) by mouth daily. 90 tablet 3   SUMAtriptan (IMITREX) 100 MG  tablet Take 0.5-1 tablets (50-100 mg total) by mouth every 2 (two) hours as needed for migraine. 10 tablet 5   hydrochlorothiazide (HYDRODIURIL) 25 MG tablet Take 1 tablet (25 mg total) by mouth daily. 90 tablet 3   lubiprostone (AMITIZA) 8 MCG capsule Take 1 capsule (8 mcg total) by mouth 2 (two) times daily with a meal. 60 capsule 1   No facility-administered medications prior to visit.    Allergies  Allergen Reactions   Lisinopril Swelling    Angioedema     Review of Systems  Gastrointestinal:  Positive for abdominal pain (Due to constipation) and constipation (Due to IBS).      Objective:    Physical Exam Constitutional:      General:  She is not in acute distress.    Appearance: Normal appearance. She is not ill-appearing.  HENT:     Head: Normocephalic and atraumatic.     Right Ear: External ear normal.     Left Ear: External ear normal.  Eyes:     Extraocular Movements: Extraocular movements intact.     Pupils: Pupils are equal, round, and reactive to light.  Cardiovascular:     Rate and Rhythm: Normal rate and regular rhythm.     Heart sounds: Normal heart sounds. No murmur heard.   No gallop.  Pulmonary:     Effort: Pulmonary effort is normal. No respiratory distress.     Breath sounds: Normal breath sounds. No wheezing or rales.  Lymphadenopathy:     Cervical: No cervical adenopathy.  Skin:    General: Skin is warm and dry.  Neurological:     Mental Status: She is alert and oriented to person, place, and time.  Psychiatric:        Behavior: Behavior normal.        Judgment: Judgment normal.    BP 132/79 (BP Location: Right Arm, Patient Position: Sitting, Cuff Size: Small)   Pulse 67   Temp 98.6 F (37 C) (Oral)   Resp 16   Wt 149 lb (67.6 kg)   SpO2 100%   BMI 26.73 kg/m  Wt Readings from Last 3 Encounters:  09/13/21 149 lb (67.6 kg)  03/13/21 152 lb (68.9 kg)  01/15/21 147 lb 1.3 oz (66.7 kg)       Assessment & Plan:   Problem List Items  Addressed This Visit       Unprioritized   Subclinical hyperthyroidism    I see that she has not seen her endocrinologist since 1/22. They were discussing possibility of RIA at that time but pt does not recall this. I have advised her to arrange follow up with endocrinology to discuss next steps.       Irritable bowel syndrome with constipation    Uncontrolled. Would like to try Linzess instead of amitiza which was not helpful. It is not clear if Linzess will be covered by her insurance but she would like for [place] to try. Rx sent.       Relevant Medications   linaclotide (LINZESS) 290 MCG CAPS capsule   Hypertension - Primary    BP stable despite not taking HCTZ. I advised pt ok to remain off of hctz.       Relevant Orders   Comp Met (CMET)   Lipid panel   Meds ordered this encounter  Medications   linaclotide (LINZESS) 290 MCG CAPS capsule    Sig: Take 1 capsule (290 mcg total) by mouth daily before breakfast.    Dispense:  30 capsule    Refill:  2    Order Specific Question:   Supervising Provider    Answer:   Penni Homans A [[id number]]    I, Debbrah Alar, NP, personally preformed the services described in this documentation.  All medical record entries made by the scribe were at my direction and in my presence.  I have reviewed the chart and discharge instructions (if applicable) and agree that the record reflects my personal performance and is accurate and complete. 09/13/2021  I,Lyric Barr-McArthur,acting as a Education administrator for Nance Pear, NP.,have documented all relevant documentation on the behalf of Nance Pear, NP,as directed by  Nance Pear, NP while in the presence of Nance Pear, NP.  Moe Graca  S O'Sullivan, NP  

## 2021-09-13 NOTE — Assessment & Plan Note (Signed)
I see that she has not seen her endocrinologist since 1/22. They were discussing possibility of RIA at that time but pt does not recall this. I have advised her to arrange follow up with endocrinology to discuss next steps.

## 2021-09-13 NOTE — Assessment & Plan Note (Signed)
Uncontrolled. Would like to try Linzess instead of amitiza which was not helpful. It is not clear if Linzess will be covered by her insurance but she would like for [place] to try. Rx sent.

## 2021-09-13 NOTE — Patient Instructions (Signed)
Please schedule a follow up visit with Dr. Kelton Pillar.

## 2021-09-17 ENCOUNTER — Ambulatory Visit: Payer: BC Managed Care – PPO | Admitting: Cardiology

## 2021-09-17 ENCOUNTER — Encounter: Payer: Self-pay | Admitting: Cardiology

## 2021-09-17 ENCOUNTER — Other Ambulatory Visit: Payer: Self-pay

## 2021-09-17 VITALS — BP 130/70 | HR 76 | Ht 62.6 in | Wt 150.0 lb

## 2021-09-17 DIAGNOSIS — E781 Pure hyperglyceridemia: Secondary | ICD-10-CM | POA: Diagnosis not present

## 2021-09-17 DIAGNOSIS — I10 Essential (primary) hypertension: Secondary | ICD-10-CM

## 2021-09-17 NOTE — Progress Notes (Signed)
Cardiology Office Note:    Date:  09/17/2021   ID:  Gloria Bass, DOB 03/04/1966, MRN [id number]  PCP:  Debbrah Alar, NP  Cardiologist:  Jenean Lindau, MD   Referring MD: Debbrah Alar, NP    ASSESSMENT:    1. Essential hypertension   2. Hypertriglyceridemia    PLAN:    In order of problems listed above:  Primary prevention stressed with the patient.  Importance of compliance with diet medication stressed and she vocalized understanding.  She was advised to walk at least half an hour a day 5 days a week and she promises to do so. Essential hypertension: Blood pressure stable.  Lifestyle modification and diet urged and she understands. Mixed dyslipidemia with hypertriglyceridemia: This is mild in nature and diet was emphasized.  She is going to diet and follow-up for blood work with primary care. Patient will be seen in follow-up appointment in 6 months or earlier if the patient has any concerns    Medication Adjustments/Labs and Tests Ordered: Current medicines are reviewed at length with the patient today.  Concerns regarding medicines are outlined above.  No orders of the defined types were placed in this encounter.  No orders of the defined types were placed in this encounter.    No chief complaint on file.    History of Present Illness:    Gloria Bass is a 55 y.o. female.  Patient has past medical history of essential hypertension and hypertriglyceridemia.  She denies any problems at this time and takes care of activities of daily living.  No chest pain orthopnea or PND.  At the time of my evaluation, the patient is alert awake oriented and in no distress.  She now diets well and is practicing intermittent fasting.  She is trying to lose weight.  She started an exercise program.  Past Medical History:  Diagnosis Date   Abnormal cervical Pap smear with positive HPV DNA test 09/20/2014   Allergic rhinitis    Anxiety 09/20/2014   Cervical  stenosis (uterine cervix)    Chest pain 11/09/2017   Colon cancer screening 04/22/2017   Displaced fracture of distal phalanx of lesser toe of left foot 09/20/2014   DYSPNEA 05/12/2008   Qualifier: Diagnosis of  By: Melvyn Novas MD, Legrand Como B    Elevated alkaline phosphatase level 11/21/2020   Endometrial polyp 06/03/2018   Essential hypertension 03/23/2017   History of pituitary tumor    prolactinoma   Hypertension    Hyperthyroidism 05/16/2008   Formatting of this note might be different from the original. Overview:  Qualifier: Diagnosis of  By: Tilden Dome   Irritable bowel syndrome with constipation 09/20/2014   Localized edema 09/15/2017   Mediastinal mass 01/15/2021   Migraines    Mild intermittent asthma with acute exacerbation 12/30/2016   Multinodular goiter 11/20/2020   Other headache syndrome 09/07/2014   Overweight 03/23/2017   Pituitary adenoma ([place]) 09/07/2014   PMB (postmenopausal bleeding)    Preventative health care 03/13/2021   Serotonin syndrome 03/23/2017   Subclinical hyperthyroidism 11/20/2020   Subclinical hypothyroidism 05/16/2008   Qualifier: Diagnosis of  By: Tilden Dome     Symptomatic menopausal or female climacteric states 09/20/2014   Thyrotoxicosis 05/16/2008   Formatting of this note might be different from the original. Overview:  Qualifier: Diagnosis of  By: Tilden Dome    Past Surgical History:  Procedure Laterality Date   COLONOSCOPY WITH PROPOFOL  2018   DOBUTAMINE STRESS ECHO  11-30-2017  dr Geraldo Pitter   normal withou evidence ischemia/  normal LVEF   HYSTEROSCOPY WITH D & C N/A 06/03/2018   Procedure: DILATATION AND CURETTAGE /HYSTEROSCOPY;  Surgeon: Arvella Nigh, MD;  Location: [place];  Service: Gynecology;  Laterality: N/A;   TRANSPHENOIDAL PITUITARY RESECTION  age 42  @ [facility]   benign    Current Medications: Current Meds  Medication Sig   amLODipine (NORVASC) 10 MG tablet Take 1 tablet (10 mg total) by mouth  daily.   Cholecalciferol (VITAMIN D) 50 MCG (2000 UT) tablet Take 2,000 Units by mouth daily.   linaclotide (LINZESS) 290 MCG CAPS capsule Take 1 capsule (290 mcg total) by mouth daily before breakfast.   metoprolol succinate (TOPROL-XL) 50 MG 24 hr tablet Take 1 tablet (50 mg total) by mouth daily.   SUMAtriptan (IMITREX) 100 MG tablet Take 0.5-1 tablets (50-100 mg total) by mouth every 2 (two) hours as needed for migraine.     Allergies:   Lisinopril   Social History   Socioeconomic History   Marital status: Legally Separated    Spouse name: Not on file   Number of children: 1   Years of education: Not on file   Highest education level: Not on file  Occupational History   Occupation: Government social research officer  Tobacco Use   Smoking status: Never   Smokeless tobacco: Never  Vaping Use   Vaping Use: Never used  Substance and Sexual Activity   Alcohol use: Yes    Comment: occasional   Drug use: No   Sexual activity: Not Currently    Birth control/protection: Post-menopausal  Other Topics Concern   Not on file  Social History Narrative   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  ROS:   Please see the history of present illness.    All other systems reviewed and are negative.  EKGs/Labs/Other Studies Reviewed:    The following studies were reviewed today: I discussed my findings with the patient at length   Recent Labs: 11/20/2020: Hemoglobin 14.3; Platelets 294.0; TSH 0.14 09/13/2021: ALT 17; BUN 9;  Creatinine, Ser 0.82; Potassium 3.9; Sodium 145  Recent Lipid Panel    Component Value Date/Time   CHOL 161 09/13/2021 1337   CHOL 176 10/26/2019 1058   TRIG 178.0 (H) 09/13/2021 1337   HDL 47.00 09/13/2021 1337   HDL 57 10/26/2019 1058   CHOLHDL 3 09/13/2021 1337   VLDL 35.6 09/13/2021 1337   LDLCALC 78 09/13/2021 1337   LDLCALC 93 10/26/2019 1058    Physical Exam:    VS:  BP 130/70   Pulse 76   Ht 5' 2.6" (1.59 m)   Wt 150 lb (68 kg)   SpO2 97%   BMI 26.91 kg/m     Wt Readings from Last 3 Encounters:  09/17/21 150 lb (68 kg)  09/13/21 149 lb (67.6 kg)  03/13/21 152 lb (68.9 kg)     GEN: Patient is in no  acute distress HEENT: Normal NECK: No JVD; No carotid bruits LYMPHATICS: No lymphadenopathy CARDIAC: Hear sounds regular, 2/6 systolic murmur at the apex. RESPIRATORY:  Clear to auscultation without rales, wheezing or rhonchi  ABDOMEN: Soft, non-tender, non-distended MUSCULOSKELETAL:  No edema; No deformity  SKIN: Warm and dry NEUROLOGIC:  Alert and oriented x 3 PSYCHIATRIC:  Normal affect   Signed, Jenean Lindau, MD  09/17/2021 2:29 PM    [place]

## 2021-09-17 NOTE — Patient Instructions (Signed)

## 2022-02-25 ENCOUNTER — Other Ambulatory Visit: Payer: Self-pay | Admitting: Cardiology

## 2022-02-25 MED ORDER — METOPROLOL SUCCINATE ER 50 MG PO TB24: 50.0000 mg | ORAL_TABLET | Freq: Every day | ORAL | 3 refills | Status: DC

## 2022-02-25 MED ORDER — AMLODIPINE BESYLATE 10 MG PO TABS: 10.0000 mg | ORAL_TABLET | Freq: Every day | ORAL | 3 refills | Status: DC

## 2022-04-30 ENCOUNTER — Telehealth: Payer: Self-pay | Admitting: Family

## 2022-04-30 ENCOUNTER — Ambulatory Visit (INDEPENDENT_AMBULATORY_CARE_PROVIDER_SITE_OTHER): Payer: BC Managed Care – PPO | Admitting: Family

## 2022-04-30 ENCOUNTER — Encounter: Payer: Self-pay | Admitting: Family

## 2022-04-30 VITALS — BP 136/80 | HR 66 | Temp 98.2°F | Resp 16 | Ht 62.5 in | Wt 137.0 lb

## 2022-04-30 DIAGNOSIS — E0590 Thyrotoxicosis, unspecified without thyrotoxic crisis or storm: Secondary | ICD-10-CM | POA: Diagnosis not present

## 2022-04-30 DIAGNOSIS — K581 Irritable bowel syndrome with constipation: Secondary | ICD-10-CM | POA: Diagnosis not present

## 2022-04-30 DIAGNOSIS — G43909 Migraine, unspecified, not intractable, without status migrainosus: Secondary | ICD-10-CM

## 2022-04-30 DIAGNOSIS — I10 Essential (primary) hypertension: Secondary | ICD-10-CM

## 2022-04-30 DIAGNOSIS — Z1211 Encounter for screening for malignant neoplasm of colon: Secondary | ICD-10-CM

## 2022-04-30 DIAGNOSIS — Z0000 Encounter for general adult medical examination without abnormal findings: Secondary | ICD-10-CM | POA: Diagnosis not present

## 2022-04-30 DIAGNOSIS — R748 Abnormal levels of other serum enzymes: Secondary | ICD-10-CM

## 2022-04-30 DIAGNOSIS — Z1231 Encounter for screening mammogram for malignant neoplasm of breast: Secondary | ICD-10-CM

## 2022-04-30 LAB — COMPREHENSIVE METABOLIC PANEL
ALT: 16 U/L (ref 0–35)
AST: 18 U/L (ref 0–37)
Albumin: 4.6 g/dL (ref 3.5–5.2)
Alkaline Phosphatase: 121 U/L — ABNORMAL HIGH (ref 39–117)
BUN: 14 mg/dL (ref 6–23)
CO2: 29 mEq/L (ref 19–32)
Calcium: 9.7 mg/dL (ref 8.4–10.5)
Chloride: 105 mEq/L (ref 96–112)
Creatinine, Ser: 0.86 mg/dL (ref 0.40–1.20)
GFR: 74.97 mL/min (ref >60.00)
Glucose, Bld: 90 mg/dL (ref 70–99)
Potassium: 4 mEq/L (ref 3.5–5.1)
Sodium: 141 mEq/L (ref 135–145)
Total Bilirubin: 0.5 mg/dL (ref 0.2–1.2)
Total Protein: 7.1 g/dL (ref 6.0–8.3)

## 2022-04-30 LAB — T4, FREE: Free T4: 0.87 ng/dL (ref 0.60–1.60)

## 2022-04-30 LAB — T3, FREE: T3, Free: 3.4 pg/mL (ref 2.3–4.2)

## 2022-04-30 LAB — TSH: TSH: 0.2 u[IU]/mL — ABNORMAL LOW (ref 0.35–5.50)

## 2022-04-30 MED ORDER — LINACLOTIDE 290 MCG PO CAPS
290.0000 ug | ORAL_CAPSULE | Freq: Every day | ORAL | 2 refills | Status: DC
Start: 2022-04-30 — End: 2023-05-18

## 2022-04-30 NOTE — Telephone Encounter (Signed)
Patient advised of results, referral and provider's comments.  She was scheduled for additional labs 05-28-22 at her request

## 2022-04-30 NOTE — Assessment & Plan Note (Addendum)
Wt Readings from Last 3 Encounters:  04/30/22 137 lb (62.1 kg)  09/17/21 150 lb (68 kg)  09/13/21 149 lb (67.6 kg)   Continue healthy diet. Weight looks great.  Mammo ordered. Hx of abnormal pap. Stressed importance of follow up with GYN.  Colo up to date.

## 2022-04-30 NOTE — Assessment & Plan Note (Signed)
On probiotic.  Improved.

## 2022-04-30 NOTE — Assessment & Plan Note (Signed)
Stable. She likes to keep imitrex on hand just in case.

## 2022-04-30 NOTE — Assessment & Plan Note (Signed)
BP Readings from Last 3 Encounters:  04/30/22 136/80  09/17/21 130/70  09/13/21 132/79   Stable. Continue amlodipine/metoprolol.

## 2022-04-30 NOTE — Telephone Encounter (Signed)
Thyroid is still overactive. I would recommend follow up with endocrinology as we discussed at her visit.  Also, her alk phos test is elevated. I would like her to return to the lab at her convenience for additional testing.  

## 2022-04-30 NOTE — Assessment & Plan Note (Signed)
>>  ASSESSMENT AND PLAN FOR ESSENTIAL HYPERTENSION WRITTEN ON 04/30/2022  9:16 AM BY O'SULLIVAN, Dinora Hemm, NP  BP Readings from Last 3 Encounters:  04/30/22 136/80  09/17/21 130/70  09/13/21 132/79   Stable. Continue amlodipine/metoprolol.

## 2022-04-30 NOTE — Patient Instructions (Signed)
Please complete lab work prior to leaving. We will work on getting you back in with your Endocrinologist for your thyroid.

## 2022-04-30 NOTE — Progress Notes (Signed)
Subjective:   By signing my name below, I, [facility], attest that this documentation has been prepared under the direction and in the presence of [facility] 04/30/2022    Patient ID: [facility], female    DOB: [birth year], 56 y.o.   MRN: [id number]  Chief Complaint  Patient presents with   Annual Exam    HPI Patient is in today for a comprehensive physical exam.   Refill: She is requesting a refill of [place].  Thyroid Follow Up: She reports that she has not received a call for a follow-up appointment for her thyroid symptoms.  Blood Pressure: Her blood pressure is normal. Her cardiologist advised her to fast. BP Readings from Last 3 Encounters:  04/30/22 136/80  09/17/21 130/70  09/13/21 132/79   Pulse Readings from Last 3 Encounters:  04/30/22 66  09/17/21 76  09/13/21 67   Weight: Her weight is decreasing. She states that what she eats has not changed and she hasn't significantly increased her exercising. However, since she is fasting, she believes that it contributes to her weight loss.  Wt Readings from Last 3 Encounters:  04/30/22 137 lb (62.1 kg)  09/17/21 150 lb (68 kg)  09/13/21 149 lb (67.6 kg)   Linzess: She is continuing to take 290 MCG of Linzess Imitrex: She wants to keep the 100 Mg of Imitrex on hand.  Prebiotics: She is currently taking probiotic supplements. She is inquiring about whether she needs to add prebiotics to her diet.  Migraine: She reports that she has not had a migraine since she received her braces about 6 years ago.    She denies having any fever, new muscle pain, joint pain , new moles, congestion, sinus pain, sore throat, chest pain, palpations, cough, SOB ,wheezing,n/v/d constipation, blood in stool, dysuria, frequency, hematuria, depression, anxiety, headaches at this time  Social history: She reports no recent surgeries. She denies of any changes to her family medical history.  Colonoscopy: Last  completed on 05/14/2017 Pap Smear: Last completed on 02/18/2021. She reports that she has a pap smear scheduled for 05/2022. Mammogram: Last completed on 07/03/2021. She does her mammograms at the [facility].  Immunizations: She has not received the bivalent Covid 19 vaccines. She is UTD on the Shingles vaccine.  Dental: She is UTD on dental exams.  Vision: She is UTD on vision exams.   Health Maintenance Due  Topic Date Due   TETANUS/TDAP  Never done   COVID-19 Vaccine (4 - Pfizer series) 11/02/2020    Past Medical History:  Diagnosis Date   Abnormal cervical Pap smear with positive HPV DNA test 09/20/2014   Allergic rhinitis    Anxiety 09/20/2014   Cervical stenosis (uterine cervix)    Chest pain 11/09/2017   Colon cancer screening 04/22/2017   Displaced fracture of distal phalanx of lesser toe of left foot 09/20/2014   DYSPNEA 05/12/2008   Qualifier: Diagnosis of  By: [facility]    Elevated alkaline phosphatase level 11/21/2020   Endometrial polyp 06/03/2018   Essential hypertension 03/23/2017   History of pituitary tumor    prolactinoma   Hypertension    Hyperthyroidism 05/16/2008   Formatting of this note might be different from the original. Overview:  Qualifier: Diagnosis of  By: [facility]   Irritable bowel syndrome with constipation 09/20/2014   Localized edema 09/15/2017   Mediastinal mass 01/15/2021   Migraines    Mild intermittent asthma with acute exacerbation  12/30/2016   Multinodular goiter 11/20/2020   Other headache syndrome 09/07/2014   Overweight 03/23/2017   Pituitary adenoma ([place]) 09/07/2014   PMB (postmenopausal bleeding)    Preventative health care 03/13/2021   Serotonin syndrome 03/23/2017   Subclinical hyperthyroidism 11/20/2020   Subclinical hypothyroidism 05/16/2008   Qualifier: Diagnosis of  By: [facility]     Symptomatic menopausal or female climacteric states 09/20/2014   Thyrotoxicosis 05/16/2008   Formatting of this note  might be different from the original. Overview:  Qualifier: Diagnosis of  By: [facility]    Past Surgical History:  Procedure Laterality Date   COLONOSCOPY WITH PROPOFOL  2018   DOBUTAMINE STRESS ECHO  11-30-2017   dr [facility]   normal withou evidence ischemia/  normal LVEF   HYSTEROSCOPY WITH D & C N/A 06/03/2018   Procedure: DILATATION AND CURETTAGE /HYSTEROSCOPY;  Surgeon: Gloria Nigh, MD;  Location: [facility];  Service: Gynecology;  Laterality: N/A;   TRANSPHENOIDAL PITUITARY RESECTION  age 83  @ [facility]   benign    Family History  Problem Relation Age of Onset   Hypertension Mother    Hypertension Father     Social History   Socioeconomic History   Marital status: Legally Separated    Spouse name: Not on file   Number of children: 1   Years of education: Not on file   Highest education level: Not on file  Occupational History   Occupation: Government social research officer  Tobacco Use   Smoking status: Never   Smokeless tobacco: Never  Vaping Use   Vaping Use: Never used  Substance and Sexual Activity   Alcohol use: Yes    Comment: occasional   Drug use: No   Sexual activity: Not Currently    Birth control/protection: Post-menopausal  Other Topics Concern   Not on file  Social History Narrative   Works as a Government social research officer for a [employer]   Single   Has son (adult)-  Lives in [place]   Enjoys making jewelry   Spending time with her niece/nephews   No pets   Social Determinants of Radio broadcast assistant Strain: Not on file  Food Insecurity: Not on file  Transportation Needs: Not on file  Physical Activity: Not on file  Stress: Not on file  Social Connections: Not on file  Intimate Partner Violence: Not on file    Outpatient Medications Prior to Visit  Medication Sig Dispense Refill   amLODipine (NORVASC) 10 MG tablet Take 1 tablet (10 mg total) by mouth daily. 90 tablet 3   Cholecalciferol (VITAMIN D) 50 MCG (2000  UT) tablet Take 2,000 Units by mouth daily.     metoprolol succinate (TOPROL-XL) 50 MG 24 hr tablet Take 1 tablet (50 mg total) by mouth daily. 90 tablet 3   SUMAtriptan (IMITREX) 100 MG tablet Take 0.5-1 tablets (50-100 mg total) by mouth every 2 (two) hours as needed for migraine. 10 tablet 5   linaclotide (LINZESS) 290 MCG CAPS capsule Take 1 capsule (290 mcg total) by mouth daily before breakfast. 30 capsule 2   No facility-administered medications prior to visit.    Allergies  Allergen Reactions   Lisinopril Swelling    Angioedema     Review of Systems  Constitutional:  Negative for fever.  HENT:  Negative for congestion, sinus pain and sore throat.   Respiratory:  Negative for wheezing.   Cardiovascular:  Negative for palpitations.  Gastrointestinal:  Negative for  blood in stool, constipation, diarrhea, nausea and vomiting.  Genitourinary:  Negative for dysuria, frequency and hematuria.  Musculoskeletal:  Negative for joint pain and myalgias.  Skin:        (-) [place]  Neurological:  Negative for headaches.  Psychiatric/Behavioral:  Negative for depression. The patient is not nervous/anxious.        Objective:    Physical Exam Constitutional:      General: She is not in acute distress.    Appearance: Normal appearance. She is not ill-appearing.  HENT:     Head: Normocephalic and atraumatic.     Right Ear: Tympanic membrane, ear canal and external ear normal.     Left Ear: Tympanic membrane, ear canal and external ear normal.  Eyes:     Extraocular Movements: Extraocular movements intact.     Pupils: Pupils are equal, round, and reactive to light.  Neck:     Thyroid: Thyromegaly present.  Cardiovascular:     Rate and Rhythm: Normal rate and regular rhythm.     Heart sounds: Normal heart sounds. No murmur heard.    No gallop.  Pulmonary:     Effort: Pulmonary effort is normal. No respiratory distress.     Breath sounds: Normal breath sounds. No wheezing or  rales.  Abdominal:     General: Bowel sounds are normal. There is no distension.     Palpations: Abdomen is soft.     Tenderness: There is no abdominal tenderness. There is no guarding.  Musculoskeletal:     Comments:  5/5 strength upper and lower extremeties   Lymphadenopathy:     Cervical: No cervical adenopathy.  Skin:    General: Skin is warm and dry.  Neurological:     Mental Status: She is alert and oriented to person, place, and time.  Psychiatric:        Mood and Affect: Mood normal.        Behavior: Behavior normal.        Judgment: Judgment normal.     BP 136/80 (BP Location: Right Arm, Patient Position: Sitting, Cuff Size: Small)   Pulse 66   Temp 98.2 F (36.8 C) (Oral)   Resp 16   Ht 5' 2.5" (1.588 m)   Wt 137 lb (62.1 kg)   SpO2 99%   BMI 24.66 kg/m  Wt Readings from Last 3 Encounters:  04/30/22 137 lb (62.1 kg)  09/17/21 150 lb (68 kg)  09/13/21 149 lb (67.6 kg)       Assessment & Plan:   Problem List Items Addressed This Visit       Unprioritized   Subclinical hyperthyroidism   Relevant Orders   TSH   T3, free   T4, free   Ambulatory referral to Endocrinology   Preventative health care - Primary    Wt Readings from Last 3 Encounters:  04/30/22 137 lb (62.1 kg)  09/17/21 150 lb (68 kg)  09/13/21 149 lb (67.6 kg)  Continue healthy diet. Weight looks great.  Mammo ordered. Hx of abnormal pap. Stressed importance of follow up with GYN.  Colo up to date.       Migraines    Stable. She likes to keep imitrex on hand just in case.       Irritable bowel syndrome with constipation    On probiotic.  Improved.       Relevant Medications   linaclotide (LINZESS) 290 MCG CAPS capsule   Hypertension   Relevant Orders   Comp  Met (CMET)   Essential hypertension    BP Readings from Last 3 Encounters:  04/30/22 136/80  09/17/21 130/70  09/13/21 132/79  Stable. Continue amlodipine/metoprolol.       Colon cancer screening   Relevant Orders    Ambulatory referral to Gastroenterology   Other Visit Diagnoses     Encounter for screening mammogram for malignant neoplasm of breast       Relevant Orders   MM 3D SCREEN BREAST BILATERAL        Meds ordered this encounter  Medications   linaclotide (LINZESS) 290 MCG CAPS capsule    Sig: Take 1 capsule (290 mcg total) by mouth daily before breakfast.    Dispense:  30 capsule    Refill:  2    Order Specific Question:   Supervising Provider    Answer:   Penni Homans A [[id number]]    I, Nance Pear, Bass, personally preformed the services described in this documentation.  All medical record entries made by the scribe were at my direction and in my presence.  I have reviewed the chart and discharge instructions (if applicable) and agree that the record reflects my personal performance and is accurate and complete. 04/30/2022   I,Amber Collins,acting as a scribe for Nance Pear, Bass.,have documented all relevant documentation on the behalf of Nance Pear, Bass,as directed by  Nance Pear, Bass while in the presence of Nance Pear, Bass.  Nance Pear, Bass

## 2022-05-28 ENCOUNTER — Other Ambulatory Visit (INDEPENDENT_AMBULATORY_CARE_PROVIDER_SITE_OTHER): Payer: BC Managed Care – PPO

## 2022-05-28 DIAGNOSIS — R748 Abnormal levels of other serum enzymes: Secondary | ICD-10-CM

## 2022-06-01 LAB — ALKALINE PHOSPHATASE, ISOENZYMES
Alkaline Phosphatase: 122 IU/L — ABNORMAL HIGH (ref 44–121)
BONE FRACTION: 66 % (ref 14–68)
INTESTINAL FRAC.: 0 % (ref 0–18)
LIVER FRACTION: 34 % (ref 18–85)

## 2022-06-01 LAB — SPECIMEN STATUS REPORT

## 2022-06-02 ENCOUNTER — Telehealth: Payer: Self-pay | Admitting: Family

## 2022-06-02 DIAGNOSIS — R748 Abnormal levels of other serum enzymes: Secondary | ICD-10-CM

## 2022-06-02 DIAGNOSIS — Z1151 Encounter for screening for human papillomavirus (HPV): Secondary | ICD-10-CM | POA: Diagnosis not present

## 2022-06-02 DIAGNOSIS — Z124 Encounter for screening for malignant neoplasm of cervix: Secondary | ICD-10-CM | POA: Diagnosis not present

## 2022-06-02 DIAGNOSIS — Z01419 Encounter for gynecological examination (general) (routine) without abnormal findings: Secondary | ICD-10-CM | POA: Diagnosis not present

## 2022-06-02 DIAGNOSIS — Z6825 Body mass index (BMI) 25.0-25.9, adult: Secondary | ICD-10-CM | POA: Diagnosis not present

## 2022-06-02 LAB — HM PAP SMEAR

## 2022-06-02 NOTE — Telephone Encounter (Signed)
Please advise pt that her alk phosphatase level remains elevated.  I would recommend that she complete a bone scan to further evaluate and rule out a bone cause for her elevated level.

## 2022-06-02 NOTE — Telephone Encounter (Signed)
Called but no answer, lvm for patient to call back 

## 2022-06-02 NOTE — Telephone Encounter (Signed)
Patient advised of results and that she will get a call from radiology to set up test

## 2022-06-04 ENCOUNTER — Other Ambulatory Visit: Payer: Self-pay | Admitting: Obstetrics and Gynecology

## 2022-06-04 DIAGNOSIS — Z803 Family history of malignant neoplasm of breast: Secondary | ICD-10-CM

## 2022-06-26 ENCOUNTER — Encounter (INDEPENDENT_AMBULATORY_CARE_PROVIDER_SITE_OTHER): Payer: BC Managed Care – PPO | Admitting: Family

## 2022-06-26 DIAGNOSIS — R051 Acute cough: Secondary | ICD-10-CM

## 2022-06-26 MED ORDER — BENZONATATE 100 MG PO CAPS: 100.0000 mg | ORAL_CAPSULE | Freq: Two times a day (BID) | ORAL | 0 refills | Status: DC | PRN

## 2022-06-26 NOTE — Telephone Encounter (Signed)

## 2022-07-15 ENCOUNTER — Ambulatory Visit: Payer: BC Managed Care – PPO

## 2022-07-24 ENCOUNTER — Ambulatory Visit: Payer: BC Managed Care – PPO

## 2022-08-11 ENCOUNTER — Ambulatory Visit
Admission: RE | Admit: 2022-08-11 | Discharge: 2022-08-11 | Disposition: A | Discharge status: Home / Self Care | Payer: BC Managed Care – PPO | Source: Ambulatory Visit | Attending: Family | Admitting: Family

## 2022-08-11 DIAGNOSIS — Z1231 Encounter for screening mammogram for malignant neoplasm of breast: Secondary | ICD-10-CM

## 2022-08-21 DIAGNOSIS — Z83719 Family history of colon polyps, unspecified: Secondary | ICD-10-CM | POA: Diagnosis not present

## 2022-08-21 DIAGNOSIS — K581 Irritable bowel syndrome with constipation: Secondary | ICD-10-CM | POA: Diagnosis not present

## 2022-08-21 DIAGNOSIS — Z86010 Personal history of colonic polyps: Secondary | ICD-10-CM | POA: Diagnosis not present

## 2022-09-22 DIAGNOSIS — K644 Residual hemorrhoidal skin tags: Secondary | ICD-10-CM | POA: Diagnosis not present

## 2022-09-22 DIAGNOSIS — Z86010 Personal history of colonic polyps: Secondary | ICD-10-CM | POA: Diagnosis not present

## 2022-09-22 DIAGNOSIS — K648 Other hemorrhoids: Secondary | ICD-10-CM | POA: Diagnosis not present

## 2022-09-22 DIAGNOSIS — K635 Polyp of colon: Secondary | ICD-10-CM | POA: Diagnosis not present

## 2022-09-22 DIAGNOSIS — Z1211 Encounter for screening for malignant neoplasm of colon: Secondary | ICD-10-CM | POA: Diagnosis not present

## 2022-09-22 DIAGNOSIS — D123 Benign neoplasm of transverse colon: Secondary | ICD-10-CM | POA: Diagnosis not present

## 2022-09-22 DIAGNOSIS — K6289 Other specified diseases of anus and rectum: Secondary | ICD-10-CM | POA: Diagnosis not present

## 2022-09-22 DIAGNOSIS — Z8719 Personal history of other diseases of the digestive system: Secondary | ICD-10-CM | POA: Diagnosis not present

## 2022-09-22 LAB — HM COLONOSCOPY

## 2022-09-23 ENCOUNTER — Encounter: Payer: Self-pay | Admitting: *Deleted

## 2022-10-08 ENCOUNTER — Ambulatory Visit
Admission: RE | Admit: 2022-10-08 | Discharge: 2022-10-08 | Disposition: A | Discharge status: Home / Self Care | Payer: BC Managed Care – PPO | Source: Ambulatory Visit | Attending: Obstetrics and Gynecology | Admitting: Obstetrics and Gynecology

## 2022-10-08 DIAGNOSIS — N6489 Other specified disorders of breast: Secondary | ICD-10-CM | POA: Diagnosis not present

## 2022-10-08 DIAGNOSIS — Z803 Family history of malignant neoplasm of breast: Secondary | ICD-10-CM

## 2022-10-08 MED ORDER — GADOPICLENOL 0.5 MMOL/ML IV SOLN
6.0000 mL | Freq: Once | INTRAVENOUS | Status: AC | PRN
  Administered 2022-10-08: 6 mL via INTRAVENOUS

## 2022-10-31 ENCOUNTER — Ambulatory Visit: Payer: BC Managed Care – PPO | Admitting: Family

## 2022-11-24 ENCOUNTER — Ambulatory Visit: Payer: BC Managed Care – PPO | Admitting: Family

## 2022-12-05 ENCOUNTER — Ambulatory Visit: Payer: BC Managed Care – PPO | Admitting: Family

## 2022-12-05 VITALS — BP 132/67 | HR 63 | Temp 98.7°F | Resp 16 | Wt 138.0 lb

## 2022-12-05 DIAGNOSIS — E0590 Thyrotoxicosis, unspecified without thyrotoxic crisis or storm: Secondary | ICD-10-CM

## 2022-12-05 DIAGNOSIS — I10 Essential (primary) hypertension: Secondary | ICD-10-CM

## 2022-12-05 DIAGNOSIS — K581 Irritable bowel syndrome with constipation: Secondary | ICD-10-CM

## 2022-12-05 DIAGNOSIS — G43909 Migraine, unspecified, not intractable, without status migrainosus: Secondary | ICD-10-CM

## 2022-12-05 DIAGNOSIS — R748 Abnormal levels of other serum enzymes: Secondary | ICD-10-CM | POA: Diagnosis not present

## 2022-12-05 LAB — COMPREHENSIVE METABOLIC PANEL
ALT: 19 U/L (ref 0–35)
AST: 20 U/L (ref 0–37)
Albumin: 4.6 g/dL (ref 3.5–5.2)
Alkaline Phosphatase: 122 U/L — ABNORMAL HIGH (ref 39–117)
BUN: 12 mg/dL (ref 6–23)
CO2: 31 mEq/L (ref 19–32)
Calcium: 9.7 mg/dL (ref 8.4–10.5)
Chloride: 103 mEq/L (ref 96–112)
Creatinine, Ser: 0.8 mg/dL (ref 0.40–1.20)
GFR: 81.42 mL/min (ref >60.00)
Glucose, Bld: 86 mg/dL (ref 70–99)
Potassium: 4.7 mEq/L (ref 3.5–5.1)
Sodium: 142 mEq/L (ref 135–145)
Total Bilirubin: 0.3 mg/dL (ref 0.2–1.2)
Total Protein: 7.1 g/dL (ref 6.0–8.3)

## 2022-12-05 LAB — TSH: TSH: 0.2 u[IU]/mL — ABNORMAL LOW (ref 0.35–5.50)

## 2022-12-05 LAB — T3, FREE: T3, Free: 3.4 pg/mL (ref 2.3–4.2)

## 2022-12-05 LAB — T4, FREE: Free T4: 0.7 ng/dL (ref 0.60–1.60)

## 2022-12-05 MED ORDER — SUMATRIPTAN SUCCINATE 100 MG PO TABS: 50.0000 mg | ORAL_TABLET | ORAL | 5 refills | Status: AC | PRN

## 2022-12-05 NOTE — Assessment & Plan Note (Signed)
Has been stable. Monitor.

## 2022-12-05 NOTE — Assessment & Plan Note (Signed)
Stable with prn use of linzess, continue same.

## 2022-12-05 NOTE — Progress Notes (Signed)
Subjective:     Patient ID: Gloria [facility], female    DOB: [birth year], 57 y.o.   MRN: [id number]  Chief Complaint  Patient presents with   Hypertension    Here for follow up    HPI Patient is in today for follow up.  HTN- maintained on toprol xl '50mg'$  and amlodipine.  BP Readings from Last 3 Encounters:  12/05/22 132/67  04/30/22 136/80  09/17/21 130/70   IBS- on linzess. Uses as needed.  She finds it helpful.    Migraines- resolved since she had braces. Keeps Imitrex prn.      Health Maintenance Due  Topic Date Due   DTaP/Tdap/Td (1 - Tdap) Never done   INFLUENZA VACCINE  Never done   COVID-19 Vaccine (4 - 2023-24 season) 07/04/2022    Past Medical History:  Diagnosis Date   Abnormal cervical Pap smear with positive HPV DNA test 09/20/2014   Allergic rhinitis    Anxiety 09/20/2014   Cervical stenosis (uterine cervix)    Chest pain 11/09/2017   Colon cancer screening 04/22/2017   Displaced fracture of distal phalanx of lesser toe of left foot 09/20/2014   DYSPNEA 05/12/2008   Qualifier: Diagnosis of  By: Melvyn Novas MD, Legrand Como B    Elevated alkaline phosphatase level 11/21/2020   Endometrial polyp 06/03/2018   Essential hypertension 03/23/2017   History of pituitary tumor    prolactinoma   Hypertension    Hyperthyroidism 05/16/2008   Formatting of this note might be different from the original. Overview:  Qualifier: Diagnosis of  By: Tilden Dome   Irritable bowel syndrome with constipation 09/20/2014   Localized edema 09/15/2017   Mediastinal mass 01/15/2021   Migraines    Mild intermittent asthma with acute exacerbation 12/30/2016   Multinodular goiter 11/20/2020   Other headache syndrome 09/07/2014   Overweight 03/23/2017   Pituitary adenoma ([place]) 09/07/2014   PMB (postmenopausal bleeding)    Preventative health care 03/13/2021   Serotonin syndrome 03/23/2017   Subclinical hyperthyroidism 11/20/2020   Subclinical hypothyroidism 05/16/2008   Qualifier:  Diagnosis of  By: Tilden Dome     Symptomatic menopausal or female climacteric states 09/20/2014   Thyrotoxicosis 05/16/2008   Formatting of this note might be different from the original. Overview:  Qualifier: Diagnosis of  By: Tilden Dome    Past Surgical History:  Procedure Laterality Date   COLONOSCOPY WITH PROPOFOL  2018   DOBUTAMINE STRESS ECHO  11-30-2017   dr Geraldo Pitter   normal withou evidence ischemia/  normal LVEF   HYSTEROSCOPY WITH D & C N/A 06/03/2018   Procedure: DILATATION AND CURETTAGE /HYSTEROSCOPY;  Surgeon: Arvella Nigh, MD;  Location: [facility];  Service: Gynecology;  Laterality: N/A;   TRANSPHENOIDAL PITUITARY RESECTION  age 8  @ [facility]   benign    Family History  Problem Relation Age of Onset   Hypertension Mother    Hypertension Father     Social History   Socioeconomic History   Marital status: Legally Separated    Spouse name: Not on file   Number of children: 1   Years of education: Not on file   Highest education level: Not on file  Occupational History   Occupation: Government social research officer  Tobacco Use   Smoking status: Never   Smokeless tobacco: Never  Vaping Use   Vaping Use: Never used  Substance and Sexual Activity   Alcohol use: Yes    Comment: occasional   Drug use: No   Sexual activity:  Not Currently    Birth control/protection: Post-menopausal  Other Topics Concern   Not on file  Social History Narrative   Works as a Government social research officer for [employer]   Single   Has son (adult)-  Lives in [place]   Enjoys making jewelry   Spending time with her niece/nephews   No pets   Social Determinants of Radio broadcast assistant Strain: Not on file  Food Insecurity: Not on file  Transportation Needs: Not on file  Physical Activity: Not on file  Stress: Not on file  Social Connections: Not on file  Intimate Partner Violence: Not on file    Outpatient Medications Prior to Visit  Medication  Sig Dispense Refill   amLODipine (NORVASC) 10 MG tablet Take 1 tablet (10 mg total) by mouth daily. 90 tablet 3   benzonatate (TESSALON) 100 MG capsule Take 1 capsule (100 mg total) by mouth 2 (two) times daily as needed for cough. 30 capsule 0   Cholecalciferol (VITAMIN D) 50 MCG (2000 UT) tablet Take 2,000 Units by mouth daily.     linaclotide (LINZESS) 290 MCG CAPS capsule Take 1 capsule (290 mcg total) by mouth daily before breakfast. 30 capsule 2   metoprolol succinate (TOPROL-XL) 50 MG 24 hr tablet Take 1 tablet (50 mg total) by mouth daily. 90 tablet 3   SUMAtriptan (IMITREX) 100 MG tablet Take 0.5-1 tablets (50-100 mg total) by mouth every 2 (two) hours as needed for migraine. 10 tablet 5   No facility-administered medications prior to visit.    Allergies  Allergen Reactions   Lisinopril Swelling    Angioedema     ROS See HPI    Objective:    Physical Exam Constitutional:      General: She is not in acute distress.    Appearance: Normal appearance. She is well-developed.  HENT:     Head: Normocephalic and atraumatic.     Right Ear: External ear normal.     Left Ear: External ear normal.  Eyes:     General: No scleral icterus. Neck:     Thyroid: No thyromegaly.  Cardiovascular:     Rate and Rhythm: Normal rate and regular rhythm.     Heart sounds: Normal heart sounds. No murmur heard. Pulmonary:     Effort: Pulmonary effort is normal. No respiratory distress.     Breath sounds: Normal breath sounds. No wheezing.  Musculoskeletal:     Cervical back: Neck supple.  Skin:    General: Skin is warm and dry.  Neurological:     Mental Status: She is alert and oriented to person, place, and time.  Psychiatric:        Mood and Affect: Mood normal.        Behavior: Behavior normal.        Thought Content: Thought content normal.        Judgment: Judgment normal.     BP 132/67 (BP Location: Right Arm, Patient Position: Sitting, Cuff Size: Small)   Pulse 63   Temp  98.7 F (37.1 C) (Oral)   Resp 16   Wt 138 lb (62.6 kg)   SpO2 99%   BMI 24.84 kg/m  Wt Readings from Last 3 Encounters:  12/05/22 138 lb (62.6 kg)  04/30/22 137 lb (62.1 kg)  09/17/21 150 lb (68 kg)       Assessment & Plan:   Problem List Items Addressed This Visit  Unprioritized   Migraines    Stable ever since she had her braces.  Monitor.       Relevant Medications   SUMAtriptan (IMITREX) 100 MG tablet   Irritable bowel syndrome with constipation    Stable with prn use of linzess, continue same.       Hyperthyroidism - Primary    Would like a referral to a different endocrinologist. Obtain TFT's.       Relevant Orders   T3, free   T4, free   TSH   Ambulatory referral to Endocrinology   Elevated alkaline phosphatase level    Has been stable. Monitor.       Other Visit Diagnoses     Essential hypertension       Relevant Orders   Comp Met (CMET)       I am having Celene Squibb. Breth maintain her Vitamin D, amLODipine, metoprolol succinate, linaclotide, benzonatate, and SUMAtriptan.  Meds ordered this encounter  Medications   SUMAtriptan (IMITREX) 100 MG tablet    Sig: Take 0.5-1 tablets (50-100 mg total) by mouth every 2 (two) hours as needed for migraine.    Dispense:  10 tablet    Refill:  5    Order Specific Question:   Supervising Provider    Answer:   Penni Homans A [[id number]]

## 2022-12-05 NOTE — Assessment & Plan Note (Signed)
Would like a referral to a different endocrinologist. Obtain TFT's.

## 2022-12-05 NOTE — Assessment & Plan Note (Signed)
Stable ever since she had her braces.  Monitor.

## 2023-04-08 ENCOUNTER — Other Ambulatory Visit: Payer: Self-pay | Admitting: Cardiology

## 2023-04-30 DIAGNOSIS — D352 Benign neoplasm of pituitary gland: Secondary | ICD-10-CM | POA: Diagnosis not present

## 2023-04-30 DIAGNOSIS — E0590 Thyrotoxicosis, unspecified without thyrotoxic crisis or storm: Secondary | ICD-10-CM | POA: Diagnosis not present

## 2023-04-30 DIAGNOSIS — E042 Nontoxic multinodular goiter: Secondary | ICD-10-CM | POA: Diagnosis not present

## 2023-05-01 DIAGNOSIS — E0590 Thyrotoxicosis, unspecified without thyrotoxic crisis or storm: Secondary | ICD-10-CM | POA: Diagnosis not present

## 2023-05-01 DIAGNOSIS — D352 Benign neoplasm of pituitary gland: Secondary | ICD-10-CM | POA: Diagnosis not present

## 2023-05-15 DIAGNOSIS — E042 Nontoxic multinodular goiter: Secondary | ICD-10-CM | POA: Diagnosis not present

## 2023-05-18 ENCOUNTER — Encounter: Payer: Self-pay | Admitting: Family

## 2023-05-18 ENCOUNTER — Telehealth: Payer: Self-pay | Admitting: Family

## 2023-05-18 ENCOUNTER — Ambulatory Visit: Payer: BC Managed Care – PPO | Admitting: Family

## 2023-05-18 VITALS — BP 129/72 | HR 64 | Temp 98.3°F | Resp 16 | Ht 62.0 in | Wt 142.0 lb

## 2023-05-18 DIAGNOSIS — J9859 Other diseases of mediastinum, not elsewhere classified: Secondary | ICD-10-CM | POA: Diagnosis not present

## 2023-05-18 DIAGNOSIS — E0590 Thyrotoxicosis, unspecified without thyrotoxic crisis or storm: Secondary | ICD-10-CM | POA: Diagnosis not present

## 2023-05-18 DIAGNOSIS — Z23 Encounter for immunization: Secondary | ICD-10-CM | POA: Diagnosis not present

## 2023-05-18 DIAGNOSIS — I10 Essential (primary) hypertension: Secondary | ICD-10-CM | POA: Diagnosis not present

## 2023-05-18 DIAGNOSIS — Z0000 Encounter for general adult medical examination without abnormal findings: Secondary | ICD-10-CM | POA: Insufficient documentation

## 2023-05-18 DIAGNOSIS — Z1231 Encounter for screening mammogram for malignant neoplasm of breast: Secondary | ICD-10-CM

## 2023-05-18 DIAGNOSIS — K581 Irritable bowel syndrome with constipation: Secondary | ICD-10-CM | POA: Diagnosis not present

## 2023-05-18 MED ORDER — LINACLOTIDE 290 MCG PO CAPS: 290.0000 ug | ORAL_CAPSULE | Freq: Every day | ORAL | 2 refills | Status: AC

## 2023-05-18 MED ORDER — AMLODIPINE BESYLATE 10 MG PO TABS: 10.0000 mg | ORAL_TABLET | Freq: Every day | ORAL | 1 refills | Status: DC

## 2023-05-18 MED ORDER — METOPROLOL SUCCINATE ER 50 MG PO TB24: 50.0000 mg | ORAL_TABLET | Freq: Every day | ORAL | 1 refills | Status: DC

## 2023-05-18 NOTE — Telephone Encounter (Signed)
Electronic request sent 

## 2023-05-18 NOTE — Progress Notes (Signed)
Subjective:     Patient ID: Gloria Bass, female    DOB: 1966-01-03, 58 y.o.   MRN: [id number]  Chief Complaint  Patient presents with   Annual Exam    HPI  Discussed the use of AI scribe software for clinical note transcription with the patient, who gave verbal consent to proceed.  History of Present Illness         Patient presents today for complete physical.  Immunizations: tetanus due Diet: was doing intermittent fasting, travelling more now making it hard  Wt Readings from Last 3 Encounters:  05/18/23 142 lb (64.4 kg)  12/05/22 138 lb (62.6 kg)  04/30/22 137 lb (62.1 kg)  Exercise:  no formal exercise.  Colonoscopy: 09/23/27 Pap Smear: due 02/19/24 Mammogram: due 10/9 Vision: up to date Dental: up to date  Multinodular goiter- During today's visit she received a phone call from her Endocrinologist (Dr. Allena Katz) at [facility].  He was calling her about her CT scan of soft tissue of the neck from 05/15/23, which noted the following:  1.  Mild interval increase in size of the multiple hyperattenuating soft tissue masses within the central compartment and partially imaged superior mediastinum, as further described above. Although possibly related to goitrous ectopic thyroid tissue, the thyroid gland appears separate from these large lesions and normal in size and morphology with few subcentimeter hypoattenuating nodules present. An indolent malignancy is a differential consideration.  2.  No significant interval change in multiple borderline enlarged lymph nodes within the left neck and left supraclavicular region.   Dr. Allena Katz advised her that he plans to refer her to a cardiothoracic surgeon to discuss excision versus biopsy of this mass.      Health Maintenance Due  Topic Date Due   COVID-19 Vaccine (5 - 2023-24 season) 07/04/2022    Past Medical History:  Diagnosis Date   Abnormal cervical Pap smear with positive HPV DNA test 09/20/2014   Allergic rhinitis     Anxiety 09/20/2014   Cervical stenosis (uterine cervix)    Chest pain 11/09/2017   Colon cancer screening 04/22/2017   Displaced fracture of distal phalanx of lesser toe of left foot 09/20/2014   DYSPNEA 05/12/2008   Qualifier: Diagnosis of  By: Sherene Sires MD, Casimiro Needle B    Elevated alkaline phosphatase level 11/21/2020   Endometrial polyp 06/03/2018   Essential hypertension 03/23/2017   History of pituitary tumor    prolactinoma   Hypertension    Hyperthyroidism 05/16/2008   Formatting of this note might be different from the original. Overview:  Qualifier: Diagnosis of  By: Vernie Murders   Irritable bowel syndrome with constipation 09/20/2014   Localized edema 09/15/2017   Mediastinal mass 01/15/2021   Migraines    Mild intermittent asthma with acute exacerbation 12/30/2016   Multinodular goiter 11/20/2020   Other headache syndrome 09/07/2014   Overweight 03/23/2017   Pituitary adenoma (HCC) 09/07/2014   PMB (postmenopausal bleeding)    Preventative health care 03/13/2021   Serotonin syndrome 03/23/2017   Subclinical hyperthyroidism 11/20/2020   Subclinical hypothyroidism 05/16/2008   Qualifier: Diagnosis of  By: Vernie Murders     Symptomatic menopausal or female climacteric states 09/20/2014   Thyrotoxicosis 05/16/2008   Formatting of this note might be different from the original. Overview:  Qualifier: Diagnosis of  By: Vernie Murders    Past Surgical History:  Procedure Laterality Date   COLONOSCOPY WITH PROPOFOL  2018   DOBUTAMINE STRESS ECHO  11-30-2017   dr Tomie China  normal withou evidence ischemia/  normal LVEF   HYSTEROSCOPY WITH D & C N/A 06/03/2018   Procedure: DILATATION AND CURETTAGE /HYSTEROSCOPY;  Surgeon: Richardean Chimera, MD;  Location: [facility];  Service: Gynecology;  Laterality: N/A;   TRANSPHENOIDAL PITUITARY RESECTION  age 19  @ [facility]   benign    Family History  Problem Relation Age of Onset   Hypertension Mother    Hypertension Father      Social History   Socioeconomic History   Marital status: Legally Separated    Spouse name: Not on file   Number of children: 1   Years of education: Not on file   Highest education level: Not on file  Occupational History   Occupation: Emergency planning/management officer  Tobacco Use   Smoking status: Never   Smokeless tobacco: Never  Vaping Use   Vaping status: Never Used  Substance and Sexual Activity   Alcohol use: Yes    Comment: occasional   Drug use: No   Sexual activity: Not Currently    Birth control/protection: Post-menopausal  Other Topics Concern   Not on file  Social History Narrative   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 : Never true  Transportation Needs: Not on file (04/30/2023)  Physical Activity: Not on file  Stress: Not on file  Social Connections: Not on file  Intimate Partner Violence: Not on file    Outpatient Medications Prior to Visit  Medication Sig Dispense Refill   Cholecalciferol (VITAMIN D) 50 MCG (2000 UT) tablet Take 2,000 Units by mouth daily.     SUMAtriptan (IMITREX) 100 MG tablet Take 0.5-1 tablets (50-100 mg total) by mouth every 2 (two) hours as needed for migraine. 10 tablet 5   amLODipine (NORVASC) 10 MG tablet TAKE 1 TABLET(10 MG) BY MOUTH DAILY 90 tablet 0   linaclotide (LINZESS) 290 MCG CAPS capsule Take 1 capsule (290 mcg total) by mouth daily before breakfast. 30 capsule 2   metoprolol succinate (TOPROL-XL) 50  MG 24 hr tablet TAKE 1 TABLET(50 MG) BY MOUTH DAILY 90 tablet 0   benzonatate (TESSALON) 100 MG capsule Take 1 capsule (100 mg total) by mouth 2 (two) times daily as needed for cough. 30 capsule 0   No facility-administered medications prior to visit.    Allergies  Allergen Reactions   Lisinopril Swelling    Angioedema     Review of Systems  Constitutional:  Negative for weight loss.  HENT:  Negative for congestion and hearing loss.   Eyes:  Negative for blurred vision.  Respiratory:  Negative for cough.   Cardiovascular:  Negative for leg swelling.  Gastrointestinal:  Positive for constipation (occasional). Negative for diarrhea.  Genitourinary:  Negative for dysuria and frequency.  Musculoskeletal:  Negative for joint pain and myalgias.  Neurological:  Negative for headaches.  Psychiatric/Behavioral:  Denies depression/anxiety       Objective:    Physical Exam   BP 129/72 (BP Location: Right Arm, Patient Position: Sitting, Cuff Size: Small)   Pulse 64   Temp 98.3 F (36.8 C) (Oral)   Resp 16   Ht 5\' 2"  (1.575 m)   Wt 142 lb (64.4 kg)   SpO2 100%   BMI 25.97 kg/m  Wt Readings from Last 3 Encounters:  05/18/23 142 lb (64.4 kg)  12/05/22 138 lb (62.6 kg)  04/30/22 137 lb (62.1 kg)  Physical Exam  Constitutional: She is oriented to person, place, and time. She appears well-developed and well-nourished. No distress.  HENT:  Head: Normocephalic and atraumatic.  Right Ear: Tympanic membrane and ear canal normal.  Left Ear: Tympanic membrane and ear canal normal.  Mouth/Throat: Oropharynx is clear and moist.  Eyes: Pupils are equal, round, and reactive to light. No scleral icterus.  Neck: Normal range of motion. No thyromegaly present.  Cardiovascular: Normal rate and regular rhythm.   No murmur heard. Pulmonary/Chest: Effort normal and breath sounds normal. No respiratory distress. He has no wheezes. She has no rales. She exhibits no tenderness.   Abdominal: Soft. Bowel sounds are normal. She exhibits no distension and no mass. There is no tenderness. There is no rebound and no guarding.  Musculoskeletal: She exhibits no edema.  Lymphadenopathy:    She has no cervical adenopathy.  Neurological: She is alert and oriented to person, place, and time. She has normal patellar reflexes. She exhibits normal muscle tone. Coordination normal.  Skin: Skin is warm and dry.  Psychiatric: She has a normal mood and affect. Her behavior is normal. Judgment and thought content normal.  Breast/Pelvic: deferred to GYN.         Assessment & Plan:        Assessment & Plan:   Problem List Items Addressed This Visit       Unprioritized   Preventative health care - Primary    Continue healthy diet. Encouraged her to exercise regularly.  Tdap today. Recommended covid and flu boosters this fall at her pharmacy. Request last Pap. Colo up to date.      Mediastinal mass    Pt is being referred to cardiothoracic surgeon for further evaluation and possible surgical removal.       Irritable bowel syndrome with constipation    Refill linzess, notes increased constipation when she travels.        Relevant Medications   linaclotide (LINZESS) 290 MCG CAPS capsule   Hyperthyroidism    Last TSH was decreased- management per Endo.      Relevant Medications   metoprolol succinate (TOPROL-XL) 50 MG 24 hr tablet   Hypertension    BP Readings from Last 3 Encounters:  05/18/23 129/72  12/05/22 132/67  04/30/22 136/80   BP stable on amlodipine and toprol xl.  Continue same.       Relevant Medications   metoprolol succinate (TOPROL-XL) 50 MG 24 hr tablet   amLODipine (NORVASC) 10 MG tablet   Breast cancer screening by mammogram    She is undergoing annual mammogram and MRI per [facility].       Other Visit Diagnoses     Need for diphtheria-tetanus-pertussis (Tdap) vaccine       Relevant Orders   Tdap vaccine greater than or equal  to 7yo IM (Completed)       I have discontinued Ruffin Frederick. Cua's benzonatate. I have also changed her metoprolol succinate  and amLODipine. Additionally, I am having her maintain her Vitamin D, SUMAtriptan, and linaclotide.  Meds ordered this encounter  Medications   linaclotide (LINZESS) 290 MCG CAPS capsule    Sig: Take 1 capsule (290 mcg total) by mouth daily before breakfast.    Dispense:  30 capsule    Refill:  2    Order Specific Question:   Supervising Provider    Answer:   Danise Edge A [[id number]]   metoprolol succinate (TOPROL-XL) 50 MG 24 hr tablet    Sig: Take 1 tablet (50 mg total) by mouth daily. Take with or immediately following a meal.    Dispense:  90 tablet    Refill:  1    1rst attempt, patient needs and appt for additional refills    Order Specific Question:   Supervising Provider    Answer:   Danise Edge A [[id number]]   amLODipine (NORVASC) 10 MG tablet    Sig: Take 1 tablet (10 mg total) by mouth daily.    Dispense:  90 tablet    Refill:  1    1rst attempt, patient needs and appt for additional refills    Order Specific Question:   Supervising Provider    Answer:   Danise Edge A [[id number]]

## 2023-05-18 NOTE — Telephone Encounter (Signed)
Please call Dr. Lisbeth Ply office and request pap.

## 2023-05-18 NOTE — Assessment & Plan Note (Signed)
BP Readings from Last 3 Encounters:  05/18/23 129/72  12/05/22 132/67  04/30/22 136/80   BP stable on amlodipine and toprol xl.  Continue same.

## 2023-05-18 NOTE — Assessment & Plan Note (Signed)
Continue healthy diet. Encouraged her to exercise regularly.  Tdap today. Recommended covid and flu boosters this fall at her pharmacy. Request last Pap. Colo up to date.

## 2023-05-18 NOTE — Patient Instructions (Signed)
VISIT SUMMARY:  During your visit, we discussed your neck mass, constipation, and hypertension. We also reviewed your general health maintenance.  YOUR PLAN:  -NECK MASS: You have a large mass in your neck that might be related to abnormal thyroid tissue. This mass is causing some discomfort and difficulty swallowing. Dr. Allena Katz plans to refer you to a surgeon who specializes in this area for further evaluation and possible removal.  He states that he will also discuss your case in a meeting with other doctors who specialize in endocrine tumors.  -CONSTIPATION: You mentioned that you experience constipation, especially when traveling. Please continue to take Linzess as needed to manage this.  -HYPERTENSION: Your high blood pressure is well controlled with Amlodipine and Metoprolol. Please continue taking these medications as prescribed.  -GENERAL HEALTH MAINTENANCE: It's important to maintain a healthy diet and regular exercise. Please continue with your annual mammogram, breast MRI, eye check, and dental visits. Consider getting a flu shot and a COVID booster at your pharmacy. We will check your cholesterol at your next visit in 6 months.  INSTRUCTIONS:  Please make an appointment with a CT surgeon for further evaluation of your neck mass per Dr. Eliane Decree recommendations.  Continue taking your medications as prescribed and maintain your general health maintenance routine. Consider getting a flu shot and a COVID booster at your pharmacy.

## 2023-05-18 NOTE — Assessment & Plan Note (Signed)
Pt is being referred to cardiothoracic surgeon for further evaluation and possible surgical removal.

## 2023-05-18 NOTE — Assessment & Plan Note (Signed)
Last TSH was decreased- management per Endo.

## 2023-05-18 NOTE — Assessment & Plan Note (Addendum)
She is undergoing annual mammogram and MRI per [facility].

## 2023-05-18 NOTE — Assessment & Plan Note (Signed)
Refill linzess, notes increased constipation when she travels.

## 2023-06-04 DIAGNOSIS — Z6825 Body mass index (BMI) 25.0-25.9, adult: Secondary | ICD-10-CM | POA: Diagnosis not present

## 2023-06-04 DIAGNOSIS — Z1151 Encounter for screening for human papillomavirus (HPV): Secondary | ICD-10-CM | POA: Diagnosis not present

## 2023-06-04 DIAGNOSIS — Z01419 Encounter for gynecological examination (general) (routine) without abnormal findings: Secondary | ICD-10-CM | POA: Diagnosis not present

## 2023-06-04 DIAGNOSIS — R319 Hematuria, unspecified: Secondary | ICD-10-CM | POA: Diagnosis not present

## 2023-06-04 DIAGNOSIS — Z124 Encounter for screening for malignant neoplasm of cervix: Secondary | ICD-10-CM | POA: Diagnosis not present

## 2023-06-05 DIAGNOSIS — R911 Solitary pulmonary nodule: Secondary | ICD-10-CM | POA: Diagnosis not present

## 2023-06-05 DIAGNOSIS — J9859 Other diseases of mediastinum, not elsewhere classified: Secondary | ICD-10-CM | POA: Diagnosis not present

## 2023-06-05 DIAGNOSIS — D352 Benign neoplasm of pituitary gland: Secondary | ICD-10-CM | POA: Diagnosis not present

## 2023-06-05 DIAGNOSIS — E042 Nontoxic multinodular goiter: Secondary | ICD-10-CM | POA: Diagnosis not present

## 2023-06-05 DIAGNOSIS — R9389 Abnormal findings on diagnostic imaging of other specified body structures: Secondary | ICD-10-CM | POA: Diagnosis not present

## 2023-07-15 DIAGNOSIS — J9859 Other diseases of mediastinum, not elsewhere classified: Secondary | ICD-10-CM | POA: Diagnosis not present

## 2023-07-15 DIAGNOSIS — R490 Dysphonia: Secondary | ICD-10-CM | POA: Diagnosis not present

## 2023-07-15 DIAGNOSIS — E0590 Thyrotoxicosis, unspecified without thyrotoxic crisis or storm: Secondary | ICD-10-CM | POA: Diagnosis not present

## 2023-07-15 DIAGNOSIS — R1310 Dysphagia, unspecified: Secondary | ICD-10-CM | POA: Diagnosis not present

## 2023-07-15 DIAGNOSIS — E049 Nontoxic goiter, unspecified: Secondary | ICD-10-CM | POA: Diagnosis not present

## 2023-07-16 DIAGNOSIS — R319 Hematuria, unspecified: Secondary | ICD-10-CM | POA: Diagnosis not present

## 2023-08-10 DIAGNOSIS — R490 Dysphonia: Secondary | ICD-10-CM | POA: Diagnosis not present

## 2023-08-10 DIAGNOSIS — J3800 Paralysis of vocal cords and larynx, unspecified: Secondary | ICD-10-CM | POA: Diagnosis not present

## 2023-08-10 DIAGNOSIS — R1314 Dysphagia, pharyngoesophageal phase: Secondary | ICD-10-CM | POA: Diagnosis not present

## 2023-08-14 DIAGNOSIS — R1310 Dysphagia, unspecified: Secondary | ICD-10-CM | POA: Diagnosis not present

## 2023-08-14 DIAGNOSIS — J9859 Other diseases of mediastinum, not elsewhere classified: Secondary | ICD-10-CM | POA: Diagnosis not present

## 2023-08-14 DIAGNOSIS — E049 Nontoxic goiter, unspecified: Secondary | ICD-10-CM | POA: Diagnosis not present

## 2023-08-14 DIAGNOSIS — E0590 Thyrotoxicosis, unspecified without thyrotoxic crisis or storm: Secondary | ICD-10-CM | POA: Diagnosis not present

## 2023-08-14 DIAGNOSIS — J3800 Paralysis of vocal cords and larynx, unspecified: Secondary | ICD-10-CM | POA: Diagnosis not present

## 2023-09-02 ENCOUNTER — Ambulatory Visit
Admission: RE | Admit: 2023-09-02 | Discharge: 2023-09-02 | Disposition: A | Discharge status: Home / Self Care | Payer: BC Managed Care – PPO | Source: Ambulatory Visit | Attending: Family | Admitting: Family

## 2023-09-02 ENCOUNTER — Other Ambulatory Visit: Payer: Self-pay | Admitting: Family

## 2023-09-02 DIAGNOSIS — Z1231 Encounter for screening mammogram for malignant neoplasm of breast: Secondary | ICD-10-CM

## 2023-09-07 ENCOUNTER — Other Ambulatory Visit: Payer: Self-pay | Admitting: Family

## 2023-09-07 DIAGNOSIS — R928 Other abnormal and inconclusive findings on diagnostic imaging of breast: Secondary | ICD-10-CM

## 2023-09-10 ENCOUNTER — Other Ambulatory Visit: Payer: BC Managed Care – PPO

## 2023-09-10 ENCOUNTER — Ambulatory Visit
Admission: RE | Admit: 2023-09-10 | Discharge: 2023-09-10 | Disposition: A | Discharge status: Home / Self Care | Payer: BC Managed Care – PPO | Source: Ambulatory Visit | Attending: Family | Admitting: Family

## 2023-09-10 DIAGNOSIS — R928 Other abnormal and inconclusive findings on diagnostic imaging of breast: Secondary | ICD-10-CM

## 2023-09-10 DIAGNOSIS — Z01818 Encounter for other preprocedural examination: Secondary | ICD-10-CM | POA: Diagnosis not present

## 2023-09-10 DIAGNOSIS — E049 Nontoxic goiter, unspecified: Secondary | ICD-10-CM | POA: Diagnosis not present

## 2023-09-17 DIAGNOSIS — E048 Other specified nontoxic goiter: Secondary | ICD-10-CM | POA: Diagnosis not present

## 2023-09-17 DIAGNOSIS — Z48812 Encounter for surgical aftercare following surgery on the circulatory system: Secondary | ICD-10-CM | POA: Diagnosis not present

## 2023-09-17 DIAGNOSIS — Z888 Allergy status to other drugs, medicaments and biological substances status: Secondary | ICD-10-CM | POA: Diagnosis not present

## 2023-09-17 DIAGNOSIS — D383 Neoplasm of uncertain behavior of mediastinum: Secondary | ICD-10-CM | POA: Diagnosis not present

## 2023-09-17 DIAGNOSIS — E049 Nontoxic goiter, unspecified: Secondary | ICD-10-CM | POA: Diagnosis not present

## 2023-09-17 DIAGNOSIS — Z8249 Family history of ischemic heart disease and other diseases of the circulatory system: Secondary | ICD-10-CM | POA: Diagnosis not present

## 2023-09-17 DIAGNOSIS — J4520 Mild intermittent asthma, uncomplicated: Secondary | ICD-10-CM | POA: Diagnosis not present

## 2023-09-17 DIAGNOSIS — I10 Essential (primary) hypertension: Secondary | ICD-10-CM | POA: Diagnosis not present

## 2023-09-17 DIAGNOSIS — J398 Other specified diseases of upper respiratory tract: Secondary | ICD-10-CM | POA: Diagnosis not present

## 2023-09-17 DIAGNOSIS — Z833 Family history of diabetes mellitus: Secondary | ICD-10-CM | POA: Diagnosis not present

## 2023-09-17 DIAGNOSIS — F419 Anxiety disorder, unspecified: Secondary | ICD-10-CM | POA: Diagnosis not present

## 2023-09-17 DIAGNOSIS — E042 Nontoxic multinodular goiter: Secondary | ICD-10-CM | POA: Diagnosis not present

## 2023-09-17 HISTORY — PX: THYROIDECTOMY: SHX17

## 2023-09-24 DIAGNOSIS — Z09 Encounter for follow-up examination after completed treatment for conditions other than malignant neoplasm: Secondary | ICD-10-CM | POA: Diagnosis not present

## 2023-10-09 DIAGNOSIS — E049 Nontoxic goiter, unspecified: Secondary | ICD-10-CM | POA: Diagnosis not present

## 2023-10-09 DIAGNOSIS — E039 Hypothyroidism, unspecified: Secondary | ICD-10-CM | POA: Diagnosis not present

## 2023-10-09 DIAGNOSIS — R911 Solitary pulmonary nodule: Secondary | ICD-10-CM | POA: Diagnosis not present

## 2023-11-18 ENCOUNTER — Ambulatory Visit: Payer: BC Managed Care – PPO | Admitting: Family

## 2023-11-18 VITALS — BP 136/75 | HR 60 | Temp 98.6°F | Resp 16 | Ht 62.0 in | Wt 147.0 lb

## 2023-11-18 DIAGNOSIS — R739 Hyperglycemia, unspecified: Secondary | ICD-10-CM | POA: Diagnosis not present

## 2023-11-18 DIAGNOSIS — N950 Postmenopausal bleeding: Secondary | ICD-10-CM

## 2023-11-18 DIAGNOSIS — D352 Benign neoplasm of pituitary gland: Secondary | ICD-10-CM | POA: Diagnosis not present

## 2023-11-18 DIAGNOSIS — F419 Anxiety disorder, unspecified: Secondary | ICD-10-CM

## 2023-11-18 DIAGNOSIS — I10 Essential (primary) hypertension: Secondary | ICD-10-CM | POA: Diagnosis not present

## 2023-11-18 DIAGNOSIS — G43909 Migraine, unspecified, not intractable, without status migrainosus: Secondary | ICD-10-CM

## 2023-11-18 DIAGNOSIS — E663 Overweight: Secondary | ICD-10-CM

## 2023-11-18 DIAGNOSIS — E890 Postprocedural hypothyroidism: Secondary | ICD-10-CM

## 2023-11-18 LAB — BASIC METABOLIC PANEL
BUN: 10 mg/dL (ref 6–23)
CO2: 30 meq/L (ref 19–32)
Calcium: 9.8 mg/dL (ref 8.4–10.5)
Chloride: 105 meq/L (ref 96–112)
Creatinine, Ser: 0.68 mg/dL (ref 0.40–1.20)
GFR: 95.6 mL/min (ref >60.00)
Glucose, Bld: 92 mg/dL (ref 70–99)
Potassium: 4.8 meq/L (ref 3.5–5.1)
Sodium: 144 meq/L (ref 135–145)

## 2023-11-18 LAB — HEMOGLOBIN A1C: Hgb A1c MFr Bld: 5.5 % (ref 4.6–6.5)

## 2023-11-18 NOTE — Assessment & Plan Note (Signed)
 BP Readings from Last 3 Encounters:  11/18/23 136/75  05/18/23 129/72  12/05/22 132/67   Bp stable, continue toprol  xl and amlodipine .

## 2023-11-18 NOTE — Progress Notes (Signed)
 Subjective:     Patient ID: Gloria Bass, female    DOB: 03/30/1966, 58 y.o.   MRN: [id number]  Chief Complaint  Patient presents with   Hypertension    Here for follow up    HPI  Discussed the use of AI scribe software for clinical note transcription with the patient, who gave verbal consent to proceed.  The patient, with a history of multinodular goiter and Graves disease, recently underwent a thyroid  goiter excision extending into the anterior mediastinum via upper hemi sternotomy.  This was  performed at [facility] on 11/14 by Dr. Jolie Neat and Dr Vira Grieves ENT.  She has not yet returned to work. She reports that she is still on disability nine weeks post-surgery and has limitations in her physical activities, particularly involving her hands. She has just started using three-pound weights for arm curls but is unable to extend or lift her arms over her head with any weights. She is under the care of Doctor Vira Grieves for her thyroid  management and is currently on levothyroxine. She has an upcoming appointment for blood work with Dr. Vira Grieves to repeat her TSH.   The patient also reports weight gain, which she attributes to her current sedentary lifestyle and increased consumption of junk food and sweets. She is also dealing with anxiety, which seems to be exacerbated by her limited mobility and inability to engage in her usual activities. She has been prescribed Xanax to help manage her anxiety. Physical Exam  Wt Readings from Last 3 Encounters:  11/18/23 147 lb (66.7 kg)  05/18/23 142 lb (64.4 kg)  12/05/22 138 lb (62.6 kg)        Health Maintenance Due  Topic Date Due   Pneumococcal Vaccine 15-29 Years old (1 of 2 - PCV) Never done   COVID-19 Vaccine (5 - 2024-25 season) 07/05/2023    Past Medical History:  Diagnosis Date   Abnormal cervical Pap smear with positive HPV DNA test 09/20/2014   Allergic rhinitis    Anxiety 09/20/2014   Cervical stenosis (uterine cervix)     Chest pain 11/09/2017   Colon cancer screening 04/22/2017   Displaced fracture of distal phalanx of lesser toe of left foot 09/20/2014   DYSPNEA 05/12/2008   Qualifier: Diagnosis of  By: Waymond Hailey MD, Bambi Lever B    Elevated alkaline phosphatase level 11/21/2020   Endometrial polyp 06/03/2018   Essential hypertension 03/23/2017   History of pituitary tumor    prolactinoma   Hypertension    Hyperthyroidism 05/16/2008   Formatting of this note might be different from the original. Overview:  Qualifier: Diagnosis of  By: Cala Castleman   Irritable bowel syndrome with constipation 09/20/2014   Localized edema 09/15/2017   Mediastinal mass 01/15/2021   Migraines    Mild intermittent asthma with acute exacerbation 12/30/2016   Multinodular goiter 11/20/2020   Other headache syndrome 09/07/2014   Overweight 03/23/2017   Pituitary adenoma (HCC) 09/07/2014   PMB (postmenopausal bleeding)    Preventative health care 03/13/2021   Serotonin syndrome 03/23/2017   Subclinical hyperthyroidism 11/20/2020   Subclinical hypothyroidism 05/16/2008   Qualifier: Diagnosis of  By: Cala Castleman     Symptomatic menopausal or female climacteric states 09/20/2014   Thyrotoxicosis 05/16/2008   Formatting of this note might be different from the original. Overview:  Qualifier: Diagnosis of  By: Cala Castleman    Past Surgical History:  Procedure Laterality Date   COLONOSCOPY WITH PROPOFOL   2018   DOBUTAMINE  STRESS ECHO  11-30-2017  dr Lafayette Pierre   normal withou evidence ischemia/  normal LVEF   HYSTEROSCOPY WITH D & C N/A 06/03/2018   Procedure: DILATATION AND CURETTAGE /HYSTEROSCOPY;  Surgeon: Merryl Abraham, MD;  Location: [facility] [place];  Service: Gynecology;  Laterality: N/A;   TRANSPHENOIDAL PITUITARY RESECTION  age 29  @ [facility]   benign    Family History  Problem Relation Age of Onset   Hypertension Mother    Hypertension Father     Social History   Socioeconomic History   Marital status:  Legally Separated    Spouse name: Not on file   Number of children: 1   Years of education: Not on file   Highest education level: Not on file  Occupational History   Occupation: Emergency planning/management officer  Tobacco Use   Smoking status: Never   Smokeless tobacco: Never  Vaping Use   Vaping status: Never Used  Substance and Sexual Activity   Alcohol use: Yes    Comment: occasional   Drug use: No   Sexual activity: Not Currently    Birth control/protection: Post-menopausal  Other Topics Concern   Not on file  Social History Narrative   Works as a Emergency planning/management officer for a [employer], drilling   Single   Has son (adult)-  Lives in [place]   [employer]   Enjoys making jewelry   Spending time with her niece/nephews   No pets   Social Drivers of Corporate investment banker Strain: Not on file  Food Insecurity: Low Risk  (04/30/2023)   Received from [facility]   Hunger Vital Sign    Worried About Running Out of Food in the Last Year: Never true    Ran Out of Food in the Last Year: Never true  Transportation Needs: Not on file (04/30/2023)  Physical Activity: Not on file  Stress: Not on file  Social Connections: Not on file  Intimate Partner Violence: Not on file    Outpatient Medications Prior to Visit  Medication Sig Dispense Refill   ALPRAZolam (XANAX) 0.5 MG tablet Take by mouth.     amLODipine  (NORVASC ) 10 MG tablet Take 1 tablet (10 mg total) by mouth daily. 90 tablet 1   Cholecalciferol (VITAMIN D) 50 MCG (2000 UT) tablet Take 2,000 Units by mouth daily.     levothyroxine (SYNTHROID) 100 MCG tablet Take by mouth.     linaclotide  (LINZESS ) 290 MCG CAPS capsule Take 1 capsule (290 mcg total) by mouth daily before breakfast. 30 capsule 2   metoprolol  succinate (TOPROL -XL) 50 MG 24 hr tablet Take 1 tablet (50 mg total) by mouth daily. Take with or immediately following a meal. 90 tablet 1   SUMAtriptan  (IMITREX ) 100 MG tablet Take 0.5-1 tablets (50-100 mg total) by mouth every 2  (two) hours as needed for migraine. 10 tablet 5   No facility-administered medications prior to visit.    Allergies  Allergen Reactions   Lisinopril Swelling    Angioedema     ROS See HPI    Objective:    Physical Exam Constitutional:      General: She is not in acute distress.    Appearance: Normal appearance. She is well-developed.  HENT:     Head: Normocephalic and atraumatic.     Right Ear: External ear normal.     Left Ear: External ear normal.  Eyes:     General: No scleral icterus. Neck:     Thyroid : No thyromegaly.  Cardiovascular:     Rate  and Rhythm: Normal rate and regular rhythm.     Heart sounds: Normal heart sounds. No murmur heard. Pulmonary:     Effort: Pulmonary effort is normal. No respiratory distress.     Breath sounds: Normal breath sounds. No wheezing.  Musculoskeletal:     Cervical back: Neck supple.  Skin:    General: Skin is warm and dry.     Comments: Well healed sternotomy scar with horizontal scar at base of neck  Neurological:     Mental Status: She is alert and oriented to person, place, and time.  Psychiatric:        Mood and Affect: Mood normal.        Behavior: Behavior normal.        Thought Content: Thought content normal.        Judgment: Judgment normal.      BP 136/75   Pulse 60   Temp 98.6 F (37 C) (Oral)   Resp 16   Ht 5\' 2"  (1.575 m)   Wt 147 lb (66.7 kg)   SpO2 99%   BMI 26.89 kg/m  Wt Readings from Last 3 Encounters:  11/18/23 147 lb (66.7 kg)  05/18/23 142 lb (64.4 kg)  12/05/22 138 lb (62.6 kg)       Assessment & Plan:   Problem List Items Addressed This Visit       Unprioritized   Post-surgical hypothyroidism   She is now on synthroid 100 mcg daily. This is being managed by ENT.  Feels well on this dose.       Relevant Medications   levothyroxine (SYNTHROID) 100 MCG tablet   RESOLVED: PMB (postmenopausal bleeding)   reports that this has been evaluated by Dr. Cloretta Danes including a D and C  which was normal. (noted endometrial polyp and benign endometrial tissue)       RESOLVED: Pituitary adenoma (HCC)          Overweight   BMI 26.8, she plans to become more active once she is cleared by surgery.      Migraines   No recent migraines.       Hypertension - Primary   BP Readings from Last 3 Encounters:  11/18/23 136/75  05/18/23 129/72  12/05/22 132/67   Bp stable, continue toprol  xl and amlodipine .       Anxiety    Reports increased anxiety, particularly on days with limited activity. Currently taking Xanax. -Encouraged to increase physical activity as tolerated to help manage anxiety. This is being prescribed by ENT.       Relevant Medications   ALPRAZolam (XANAX) 0.5 MG tablet   Other Visit Diagnoses       Hyperglycemia       Relevant Orders   Basic Metabolic Panel (BMET)   HgB A1c       I am having Coralyn Derry. Sissom maintain her Vitamin D, SUMAtriptan , linaclotide , metoprolol  succinate, amLODipine , ALPRAZolam, and levothyroxine.  No orders of the defined types were placed in this encounter.

## 2023-11-18 NOTE — Assessment & Plan Note (Signed)
 reports that this has been evaluated by Dr. Cloretta Danes including a D and C which was normal. (noted endometrial polyp and benign endometrial tissue)

## 2023-11-18 NOTE — Assessment & Plan Note (Signed)
No recent migraines.  

## 2023-11-18 NOTE — Assessment & Plan Note (Signed)
 BMI 26.8, she plans to become more active once she is cleared by surgery.

## 2023-11-18 NOTE — Assessment & Plan Note (Signed)
  Reports increased anxiety, particularly on days with limited activity. Currently taking Xanax. -Encouraged to increase physical activity as tolerated to help manage anxiety. This is being prescribed by ENT.

## 2023-11-18 NOTE — Patient Instructions (Signed)
 VISIT SUMMARY:  During today's visit, we discussed your recovery from thyroid  surgery, weight gain, anxiety, and other health concerns. We reviewed your current medications and made plans for follow-up appointments and lab work.  YOUR PLAN:  -POST-THYROIDECTOMY: You are nine weeks post-thyroidectomy and experiencing limited upper body strength and mobility. You are currently taking levothyroxine and will have an upcoming appointment to check if your dosage needs adjustment. Continue with your current regimen and follow up with your specialists as planned.  -WEIGHT GAIN: You have gained weight since your surgery, likely due to decreased physical activity and increased intake of high-calorie foods. Gradually increase your physical activity as tolerated and make healthier dietary choices.  -ANXIETY: You are experiencing increased anxiety, especially on days with limited activity. You are currently taking Xanax. Increasing your physical activity as tolerated may help manage your anxiety.  -HYPERTENSION: Your high blood pressure is well-controlled with your current medications, Amlodipine  10mg  and Toprol  XL 15mg . Continue with your current regimen.  -MIGRAINE: You have not had any recent migraine episodes. Continue with your current management plan.  -PLAN FOR LAB WORK: Your recent blood work showed good kidney function and slightly elevated blood sugar. We will check your blood sugar today.  INSTRUCTIONS:  Please follow up with your specialists for thyroid  management and attend your upcoming blood work appointment to check your levothyroxine dosage. Plan for your annual physical in six months. If your endocrinologist recommends, we may consider managing your thyroid  condition in the primary care setting in the future.

## 2023-11-18 NOTE — Assessment & Plan Note (Signed)
 She is now on synthroid 100 mcg daily. This is being managed by ENT.  Feels well on this dose.

## 2023-12-22 DIAGNOSIS — E890 Postprocedural hypothyroidism: Secondary | ICD-10-CM | POA: Diagnosis not present

## 2023-12-25 DIAGNOSIS — E0590 Thyrotoxicosis, unspecified without thyrotoxic crisis or storm: Secondary | ICD-10-CM | POA: Diagnosis not present

## 2023-12-31 ENCOUNTER — Emergency Department (HOSPITAL_BASED_OUTPATIENT_CLINIC_OR_DEPARTMENT_OTHER)
Admission: EM | Admit: 2023-12-31 | Discharge: 2023-12-31 | Disposition: A | Discharge status: Home / Self Care | Payer: BC Managed Care – PPO | Attending: Emergency Medicine | Admitting: Emergency Medicine

## 2023-12-31 ENCOUNTER — Encounter (HOSPITAL_BASED_OUTPATIENT_CLINIC_OR_DEPARTMENT_OTHER): Payer: Self-pay | Admitting: Emergency Medicine

## 2023-12-31 ENCOUNTER — Other Ambulatory Visit: Payer: Self-pay

## 2023-12-31 DIAGNOSIS — J101 Influenza due to other identified influenza virus with other respiratory manifestations: Secondary | ICD-10-CM | POA: Diagnosis not present

## 2023-12-31 DIAGNOSIS — I10 Essential (primary) hypertension: Secondary | ICD-10-CM | POA: Diagnosis not present

## 2023-12-31 DIAGNOSIS — R000 Tachycardia, unspecified: Secondary | ICD-10-CM | POA: Diagnosis not present

## 2023-12-31 DIAGNOSIS — R509 Fever, unspecified: Secondary | ICD-10-CM | POA: Diagnosis not present

## 2023-12-31 LAB — GROUP A STREP BY PCR: Group A Strep by PCR: NOT DETECTED

## 2023-12-31 LAB — RESP PANEL BY RT-PCR (RSV, FLU A&B, COVID)  RVPGX2
Influenza A by PCR: POSITIVE — AB
Influenza B by PCR: NEGATIVE
Resp Syncytial Virus by PCR: NEGATIVE
SARS Coronavirus 2 by RT PCR: NEGATIVE

## 2023-12-31 MED ORDER — OSELTAMIVIR PHOSPHATE 75 MG PO CAPS: 75.0000 mg | ORAL_CAPSULE | Freq: Two times a day (BID) | ORAL | 0 refills | Status: DC

## 2023-12-31 MED ORDER — ONDANSETRON 4 MG PO TBDP
4.0000 mg | ORAL_TABLET | Freq: Once | ORAL | Status: AC
  Administered 2023-12-31: 4 mg via ORAL
  Filled 2023-12-31: qty 1

## 2023-12-31 MED ORDER — ONDANSETRON 4 MG PO TBDP: 4.0000 mg | ORAL_TABLET | Freq: Three times a day (TID) | ORAL | 0 refills | Status: DC | PRN

## 2023-12-31 MED ORDER — BENZONATATE 100 MG PO CAPS: 100.0000 mg | ORAL_CAPSULE | Freq: Three times a day (TID) | ORAL | 0 refills | Status: DC

## 2023-12-31 MED ORDER — ACETAMINOPHEN 325 MG PO TABS
650.0000 mg | ORAL_TABLET | Freq: Once | ORAL | Status: AC | PRN
  Administered 2023-12-31: 650 mg via ORAL
  Filled 2023-12-31: qty 2

## 2023-12-31 NOTE — ED Provider Notes (Signed)
 Emergency Department Provider Note   I have reviewed the triage vital signs and the nursing notes.   HISTORY  Chief Complaint Flu Like Symptoms   HPI Gloria Bass is a 58 y.o. female past history reviewed below including thyroidectomy currently on Synthroid presents to the emergency department with fever, body aches, chills, congestion.  She has associated cough and some nausea.  Symptoms began last night and have worsened throughout the day.  She describes severe fatigue.  She had some occasional heart palpitations as well but denies chest pain or shortness of breath.  No diarrhea.  She is been compliant with her home medications.  She did return to work recently spending 3 days in the office this past week.   Past Medical History:  Diagnosis Date   Abnormal cervical Pap smear with positive HPV DNA test 09/20/2014   Allergic rhinitis    Anxiety 09/20/2014   Cervical stenosis (uterine cervix)    Chest pain 11/09/2017   Colon cancer screening 04/22/2017   Displaced fracture of distal phalanx of lesser toe of left foot 09/20/2014   DYSPNEA 05/12/2008   Qualifier: Diagnosis of  By: Sherene Sires MD, Casimiro Needle B    Elevated alkaline phosphatase level 11/21/2020   Endometrial polyp 06/03/2018   Essential hypertension 03/23/2017   History of pituitary tumor    prolactinoma   Hypertension    Hyperthyroidism 05/16/2008   Formatting of this note might be different from the original. Overview:  Qualifier: Diagnosis of  By: Vernie Murders   Irritable bowel syndrome with constipation 09/20/2014   Localized edema 09/15/2017   Mediastinal mass 01/15/2021   Migraines    Mild intermittent asthma with acute exacerbation 12/30/2016   Multinodular goiter 11/20/2020   Other headache syndrome 09/07/2014   Overweight 03/23/2017   Pituitary adenoma (HCC) 09/07/2014   PMB (postmenopausal bleeding)    Preventative health care 03/13/2021   Serotonin syndrome 03/23/2017   Subclinical hyperthyroidism  11/20/2020   Subclinical hypothyroidism 05/16/2008   Qualifier: Diagnosis of  By: Vernie Murders     Symptomatic menopausal or female climacteric states 09/20/2014   Thyrotoxicosis 05/16/2008   Formatting of this note might be different from the original. Overview:  Qualifier: Diagnosis of  By: Vernie Murders    Review of Systems  Constitutional: Positive fever/chills, body aches, and fatigue.  Cardiovascular: Denies chest pain. Respiratory: Denies shortness of breath. Gastrointestinal: No abdominal pain. Positive nausea.  Genitourinary: Negative for dysuria. Musculoskeletal: Negative for back pain. Skin: Negative for rash. Neurological: Positive HA.   ____________________________________________   PHYSICAL EXAM:  VITAL SIGNS: ED Triage Vitals  Encounter Vitals Group     BP 12/31/23 1819 134/73     Pulse Rate 12/31/23 1819 (!) 124     Resp 12/31/23 1819 20     Temp 12/31/23 1819 (!) 103 F (39.4 C)     Temp Source 12/31/23 1819 Oral     SpO2 12/31/23 1819 98 %     Weight 12/31/23 1827 145 lb (65.8 kg)     Height 12/31/23 1827 5\' 2"  (1.575 m)   Constitutional: Alert and oriented. Well appearing and in no acute distress. Eyes: Conjunctivae are normal.  Head: Atraumatic. Nose: No congestion/rhinnorhea. Mouth/Throat: Mucous membranes are moist. Neck: No stridor.   Cardiovascular: tachycardia. Good peripheral circulation. Grossly normal heart sounds.   Respiratory: Normal respiratory effort.  No retractions. Lungs CTAB. Gastrointestinal: Soft and nontender. No distention.  Musculoskeletal:  No gross deformities of extremities. Neurologic:  Normal speech and language.  Skin:  Skin is warm, dry and intact. No rash noted.  ____________________________________________   LABS (all labs ordered are listed, but only abnormal results are displayed)  Labs Reviewed  RESP PANEL BY RT-PCR (RSV, FLU A&B, COVID)  [id number] - Abnormal; Notable for the following components:       Result Value   Influenza A by PCR POSITIVE (*)    All other components within normal limits  GROUP A STREP BY PCR   ____________________________________________  EKG   EKG Interpretation Date/Time:  Thursday December 31 2023 19:58:48 EST Ventricular Rate:  106 PR Interval:  113 QRS Duration:  83 QT Interval:  336 QTC Calculation: 447 R Axis:   61  Text Interpretation: Sinus tachycardia Borderline repolarization abnormality No STEMI Confirmed by Alona Bene [phone]) on 12/31/2023 8:04:01 PM       ____________________________________________   PROCEDURES  Procedure(s) performed:   Procedures  None  ____________________________________________   INITIAL IMPRESSION / ASSESSMENT AND PLAN / ED COURSE  Pertinent labs & imaging results that were available during my care of the patient were reviewed by me and considered in my medical decision making (see chart for details).   This patient is Presenting for Evaluation of flu-symptoms, which does require a range of treatment options, and is a complaint that involves a moderate risk of morbidity and mortality.  The Differential Diagnoses include Flu, COVID, RSV, CAP, sepsis, thyroid storm, etc.  Critical Interventions-    Medications  acetaminophen (TYLENOL) tablet 650 mg (650 mg Oral Given 12/31/23 1855)  ondansetron (ZOFRAN-ODT) disintegrating tablet 4 mg (4 mg Oral Given 12/31/23 1952)    Reassessment after intervention: HR improving with fever reduction.   Clinical Laboratory Tests Ordered, included PCR positive for Flu A.   Radiologic Tests: Consider chest x-ray but no shortness of breath or chest pain.  No hypoxemia.  Lung exam is symmetrical and clear.  Cardiac Monitor Tracing which shows tachycardia.   Medical Decision Making: Summary:  Patient presents to the emergency department for evaluation of cough, flulike symptoms.  She arrives with fever and mild sinus tachycardia.  No arrhythmia or acute change on EKG.   She is testing positive for flu A which explains her symptoms well with her vital signs.  Her heart rate is decreasing on reassessment after fever reducing medication.  I have exceedingly low suspicion for thyroid storm.  Plan for close PCP follow-up.  Discussed the risk/benefit/side effect profile of Tamiflu and the patient would like to pursue this medication.  Did discharge with prescription for Zofran along with Tessalon Perles.  Patient's presentation is most consistent with acute, uncomplicated illness.   Disposition: discharge  ____________________________________________  FINAL CLINICAL IMPRESSION(S) / ED DIAGNOSES  Final diagnoses:  Influenza A     NEW OUTPATIENT MEDICATIONS STARTED DURING THIS VISIT:  New Prescriptions   BENZONATATE (TESSALON) 100 MG CAPSULE    Take 1 capsule (100 mg total) by mouth every 8 (eight) hours.   ONDANSETRON (ZOFRAN-ODT) 4 MG DISINTEGRATING TABLET    Take 1 tablet (4 mg total) by mouth every 8 (eight) hours as needed.   OSELTAMIVIR (TAMIFLU) 75 MG CAPSULE    Take 1 capsule (75 mg total) by mouth every 12 (twelve) hours.    Note:  This document was prepared using Dragon voice recognition software and may include unintentional dictation errors.  Alona Bene, MD, [facility]    Beckham Buxbaum, Arlyss Repress, MD 12/31/23 2012

## 2023-12-31 NOTE — Discharge Instructions (Signed)
 You were diagnosed with the flu (influenza).  You will feel ill for as much as a few weeks.  Please take any prescribed medications as instructed, and you may use over-the-counter Tylenol and/or ibuprofen as needed according to label instructions (unless you have an allergy to either or have been told by your doctor not to take them).  Follow up with your physician as instructed above, and return to the Emergency Department (ED) if you are unable to tolerate fluids due to vomiting, have worsening trouble breathing, become extremely tired or difficult to awaken, or if you develop any other symptoms that concern you.

## 2023-12-31 NOTE — ED Notes (Signed)
 Discharge paperwork reviewed entirely with patient, including follow up care. Pain was under control. The patient received instruction and coaching on their prescriptions, and all follow-up questions were answered.  Pt verbalized understanding as well as all parties involved. No questions or concerns voiced at the time of discharge. No acute distress noted.   Pt ambulated out to PVA without incident or assistance.  Pt advised they will notify their PCP immediately.  The pt was instructed to set up and/or review MyChart for their results; and was informed their Providers all have access to the information as well.

## 2023-12-31 NOTE — ED Triage Notes (Signed)
 Pt reports chills, body aches, fever, and cough since yesterday,

## 2023-12-31 NOTE — ED Notes (Signed)
 Cannot discharge, registration in chart.

## 2024-03-23 ENCOUNTER — Other Ambulatory Visit: Payer: Self-pay | Admitting: Family

## 2024-03-23 DIAGNOSIS — E039 Hypothyroidism, unspecified: Secondary | ICD-10-CM | POA: Diagnosis not present

## 2024-03-30 DIAGNOSIS — E039 Hypothyroidism, unspecified: Secondary | ICD-10-CM | POA: Diagnosis not present

## 2024-04-04 DIAGNOSIS — M25512 Pain in left shoulder: Secondary | ICD-10-CM | POA: Diagnosis not present

## 2024-04-04 DIAGNOSIS — M25511 Pain in right shoulder: Secondary | ICD-10-CM | POA: Diagnosis not present

## 2024-04-04 DIAGNOSIS — R29898 Other symptoms and signs involving the musculoskeletal system: Secondary | ICD-10-CM | POA: Diagnosis not present

## 2024-04-04 DIAGNOSIS — E039 Hypothyroidism, unspecified: Secondary | ICD-10-CM | POA: Diagnosis not present

## 2024-04-04 DIAGNOSIS — M7500 Adhesive capsulitis of unspecified shoulder: Secondary | ICD-10-CM | POA: Diagnosis not present

## 2024-04-04 DIAGNOSIS — M25619 Stiffness of unspecified shoulder, not elsewhere classified: Secondary | ICD-10-CM | POA: Diagnosis not present

## 2024-04-04 DIAGNOSIS — R293 Abnormal posture: Secondary | ICD-10-CM | POA: Diagnosis not present

## 2024-04-11 DIAGNOSIS — E039 Hypothyroidism, unspecified: Secondary | ICD-10-CM | POA: Diagnosis not present

## 2024-04-11 DIAGNOSIS — R29898 Other symptoms and signs involving the musculoskeletal system: Secondary | ICD-10-CM | POA: Diagnosis not present

## 2024-04-11 DIAGNOSIS — M25511 Pain in right shoulder: Secondary | ICD-10-CM | POA: Diagnosis not present

## 2024-04-11 DIAGNOSIS — R293 Abnormal posture: Secondary | ICD-10-CM | POA: Diagnosis not present

## 2024-04-11 DIAGNOSIS — M7500 Adhesive capsulitis of unspecified shoulder: Secondary | ICD-10-CM | POA: Diagnosis not present

## 2024-04-11 DIAGNOSIS — M25619 Stiffness of unspecified shoulder, not elsewhere classified: Secondary | ICD-10-CM | POA: Diagnosis not present

## 2024-04-11 DIAGNOSIS — M25512 Pain in left shoulder: Secondary | ICD-10-CM | POA: Diagnosis not present

## 2024-05-18 ENCOUNTER — Encounter: Payer: Self-pay | Admitting: Family

## 2024-05-18 ENCOUNTER — Ambulatory Visit (INDEPENDENT_AMBULATORY_CARE_PROVIDER_SITE_OTHER): Payer: BC Managed Care – PPO | Admitting: Family

## 2024-05-18 VITALS — BP 120/58 | HR 70 | Temp 98.2°F | Resp 16 | Ht 62.0 in | Wt 152.0 lb

## 2024-05-18 DIAGNOSIS — E890 Postprocedural hypothyroidism: Secondary | ICD-10-CM

## 2024-05-18 DIAGNOSIS — K581 Irritable bowel syndrome with constipation: Secondary | ICD-10-CM

## 2024-05-18 DIAGNOSIS — E781 Pure hyperglyceridemia: Secondary | ICD-10-CM | POA: Diagnosis not present

## 2024-05-18 DIAGNOSIS — I10 Essential (primary) hypertension: Secondary | ICD-10-CM | POA: Diagnosis not present

## 2024-05-18 DIAGNOSIS — E559 Vitamin D deficiency, unspecified: Secondary | ICD-10-CM | POA: Diagnosis not present

## 2024-05-18 DIAGNOSIS — G43909 Migraine, unspecified, not intractable, without status migrainosus: Secondary | ICD-10-CM | POA: Diagnosis not present

## 2024-05-18 DIAGNOSIS — M7502 Adhesive capsulitis of left shoulder: Secondary | ICD-10-CM | POA: Insufficient documentation

## 2024-05-18 DIAGNOSIS — Z0000 Encounter for general adult medical examination without abnormal findings: Secondary | ICD-10-CM

## 2024-05-18 DIAGNOSIS — G4700 Insomnia, unspecified: Secondary | ICD-10-CM | POA: Insufficient documentation

## 2024-05-18 LAB — VITAMIN D 25 HYDROXY (VIT D DEFICIENCY, FRACTURES): VITD: 39.67 ng/mL (ref 30.00–100.00)

## 2024-05-18 LAB — LIPID PANEL
Cholesterol: 183 mg/dL (ref 0–200)
HDL: 48.6 mg/dL (ref >39.00)
LDL Cholesterol: 104 mg/dL — ABNORMAL HIGH (ref 0–99)
NonHDL: 134.33
Total CHOL/HDL Ratio: 4
Triglycerides: 154 mg/dL — ABNORMAL HIGH (ref 0.0–149.0)
VLDL: 30.8 mg/dL (ref 0.0–40.0)

## 2024-05-18 MED ORDER — HYDROXYZINE PAMOATE 25 MG PO CAPS: 25.0000 mg | ORAL_CAPSULE | Freq: Every evening | ORAL | 2 refills | Status: DC | PRN

## 2024-05-18 NOTE — Assessment & Plan Note (Addendum)
  Candidate for pneumonia and hepatitis B vaccines. Experienced flu post-surgery, reconsidering flu vaccine. 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 Over due for GYN follow up but follow-up scheduled. Missed breast MRI due to surgery, plans to discuss rescheduling with her GYN. Up to date with dental cleanings; missed eye exam last year plans to schedule - Consider flu vaccine this fall, declines prevnar and hep b. - Attend scheduled Pap smear on September 4th. - Discuss breast MRI with gynecologist. -Continue regular exercise. Rec counting calories for weight loss- goal 1200-1500 calories/day.

## 2024-05-18 NOTE — Progress Notes (Signed)
 Subjective:     Patient ID: Gloria Bass, female    DOB: Oct 04, 1966, 58 y.o.   MRN: [id number]  Chief Complaint  Patient presents with   Annual Exam    HPI  Discussed the use of AI scribe software for clinical note transcription with the patient, who gave verbal consent to proceed.  History of Present Illness  Gloria Bass is a 58 year old female who presents for an annual physical exam.  She underwent thyroid  surgery in November and is on Synthroid 75 mcg. Her thyroid  hormone levels were elevated a month ago, leading to a reduced dosage. She has a follow-up scheduled in September. She is concerned about weight gain despite increased activity and healthier eating habits. She experiences constant hunger and often seeks food.  After returning to work post-surgery, she experienced flu-like symptoms with a high fever of 103F and severe fatigue, leading to urgent care evaluation. Her blood pressure is managed with metoprolol  and amlodipine , with stable readings. She monitors her blood pressure with an app-connected cuff, noting occasional low readings without dizziness.  Her migraines ceased after getting braces, and she is considering braces again due to not wearing retainers post-surgery. Her IBS symptoms are well-managed at home but worsen during travel, using Linzess  as needed.  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 She missed her follow up last year due to needing to reschedule around her thyroid  surgery. with a follow-up appointment on September 4th. She missed a scheduled breast MRI due to surgery recovery and plans to reschedule it due to dense breast tissue and family history.  She adheres to a strict medication schedule, taking thyroid  medication early in the morning and blood pressure medication at night.  Immunizations: declines prevnar/hep b Diet: improving Exercise: improving Colonoscopy: Pap Smear: was abnormal 2023  (CIN-1, + HR HPV) 9/4 pap scheduled with GYN Mammogram: 09/10/23 Vision: due Dental: up to date  Lab Results  Component Value Date   CHOL 161 09/13/2021   HDL 47.00 09/13/2021   LDLCALC 78 09/13/2021   TRIG 178.0 (H) 09/13/2021   CHOLHDL 3 09/13/2021         Health Maintenance Due  Topic Date Due   COVID-19 Vaccine (5 - 2024-25 season) 07/05/2023    Past Medical History:  Diagnosis Date   Abnormal cervical Pap smear with positive HPV DNA test 09/20/2014   Allergic rhinitis    Anxiety 09/20/2014   Cervical stenosis (uterine cervix)    Chest pain 11/09/2017   Colon cancer screening 04/22/2017   Displaced fracture of distal phalanx of lesser toe of left foot 09/20/2014   DYSPNEA 05/12/2008   Qualifier: Diagnosis of  By: Darlean MD, Ozell B    Elevated alkaline phosphatase level 11/21/2020   Endometrial polyp 06/03/2018   Essential hypertension 03/23/2017   History of pituitary tumor    prolactinoma   Hypertension    Hyperthyroidism 05/16/2008   Formatting of this note might be different from the original. Overview:  Qualifier: Diagnosis of  By: Winona Raring   Irritable bowel syndrome with constipation 09/20/2014   Localized edema 09/15/2017   Mediastinal mass 01/15/2021   Migraines    Mild intermittent asthma with acute exacerbation 12/30/2016   Multinodular goiter 11/20/2020   Other headache syndrome 09/07/2014   Overweight 03/23/2017   Pituitary adenoma (HCC) 09/07/2014   PMB (postmenopausal bleeding)    Preventative health care 03/13/2021   Serotonin syndrome 03/23/2017   Subclinical  hyperthyroidism 11/20/2020   Subclinical hypothyroidism 05/16/2008   Qualifier: Diagnosis of  By: Winona Raring     Symptomatic menopausal or female climacteric states 09/20/2014   Thyrotoxicosis 05/16/2008   Formatting of this note might be different from the original. Overview:  Qualifier: Diagnosis of  By: Winona Raring    Past Surgical History:  Procedure Laterality Date    COLONOSCOPY WITH PROPOFOL   2018   DOBUTAMINE STRESS ECHO  11-30-2017   dr edwyna   normal withou evidence ischemia/  normal LVEF   HYSTEROSCOPY WITH D & C N/A 06/03/2018   Procedure: DILATATION AND CURETTAGE /HYSTEROSCOPY;  Surgeon: Leva Rush, MD;  Location: [facility];  Service: Gynecology;  Laterality: N/A;   THYROIDECTOMY  09/17/2023   TRANSPHENOIDAL PITUITARY RESECTION  age 76  @ [facility]   benign    Family History  Problem Relation Age of Onset   Hypertension Mother    Hypertension Father     Social History   Socioeconomic History   Marital status: Legally Separated    Spouse name: Not on file   Number of children: 1   Years of education: Not on file   Highest education level: Not on file  Occupational History   Occupation: Emergency planning/management officer  Tobacco Use   Smoking status: Never   Smokeless tobacco: Never  Vaping Use   Vaping status: Never Used  Substance and Sexual Activity   Alcohol use: Yes    Comment: occasional   Drug use: No   Sexual activity: Not Currently    Birth control/protection: Post-menopausal  Other Topics Concern   Not on file  Social History Narrative   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 : Never true  Transportation Needs: Not on file (04/30/2023)  Physical Activity: Not on file  Stress: Not on file  Social Connections: Not on file  Intimate Partner Violence: Not on file    Outpatient  Medications Prior to Visit  Medication Sig Dispense Refill   amLODipine  (NORVASC ) 10 MG tablet Take 1 tablet (10 mg total) by mouth daily. 90 tablet 1   Cholecalciferol (VITAMIN D ) 50 MCG (2000 UT) tablet Take 2,000 Units by mouth daily.     levothyroxine (SYNTHROID) 75 MCG tablet Take 75 mcg by mouth daily.     linaclotide  (LINZESS ) 290 MCG CAPS capsule Take 1 capsule (290 mcg total) by mouth daily before breakfast. 30 capsule 2   metoprolol  succinate (TOPROL -XL) 50 MG 24 hr tablet Take 1 tablet (50 mg total) by mouth daily. Take with or immediately following a meal 90 tablet 1   SUMAtriptan  (IMITREX ) 100 MG tablet Take 0.5-1 tablets (50-100 mg total) by mouth every 2 (two) hours as needed for migraine. 10 tablet 5   ALPRAZolam (XANAX) 0.5 MG tablet Take by mouth.     benzonatate  (TESSALON ) 100  MG capsule Take 1 capsule (100 mg total) by mouth every 8 (eight) hours. 21 capsule 0   oseltamivir  (TAMIFLU ) 75 MG capsule Take 1 capsule (75 mg total) by mouth every 12 (twelve) hours. 10 capsule 0   levothyroxine (SYNTHROID) 100 MCG tablet Take by mouth.     ondansetron  (ZOFRAN -ODT) 4 MG disintegrating tablet Take 1 tablet (4 mg total) by mouth every 8 (eight) hours as needed. 20 tablet 0   No facility-administered medications prior to visit.    Allergies  Allergen Reactions   Lisinopril Swelling    Angioedema     ROS    See HPI  Objective:    Physical Exam   BP (!) 120/58 (BP Location: Right Arm, Patient Position: Sitting, Cuff Size: Normal)   Pulse 70   Temp 98.2 F (36.8 C) (Oral)   Resp 16   Ht 5' 2 (1.575 m)   Wt 152 lb (68.9 kg)   SpO2 99%   BMI 27.80 kg/m  Wt Readings from Last 3 Encounters:  05/18/24 152 lb (68.9 kg)  12/31/23 145 lb (65.8 kg)  11/18/23 147 lb (66.7 kg)   Physical Exam  Constitutional: She is oriented to person, place, and time. She appears well-developed and well-nourished. No distress.  HENT:  Head: Normocephalic and atraumatic.  Right Ear:  Tympanic membrane and ear canal normal.  Left Ear: Tympanic membrane and ear canal normal.  Mouth/Throat: Oropharynx is clear and moist.  Eyes: Pupils are equal, round, and reactive to light. No scleral icterus.  Neck: Normal range of motion. No thyromegaly present.  Cardiovascular: Normal rate and regular rhythm.   No murmur heard. Pulmonary/Chest: Effort normal and breath sounds normal. No respiratory distress. He has no wheezes. She has no rales. She exhibits no tenderness.  Abdominal: Soft. Bowel sounds are normal. She exhibits no distension and no mass. There is no tenderness. There is no rebound and no guarding.  Musculoskeletal: She exhibits no edema.  Lymphadenopathy:    She has no cervical adenopathy.  Neurological: She is alert and oriented to person, place, and time. She has normal patellar reflexes. She exhibits normal muscle tone. Coordination normal.  Skin: Skin is warm and dry.  Psychiatric: She has a normal mood and affect. Her behavior is normal. Judgment and thought content normal.  Breast/pelvic: deferred       Assessment & Plan:       Assessment & Plan:   Problem List Items Addressed This Visit       Unprioritized   Preventative health care    Candidate for pneumonia and hepatitis B vaccines. Experienced flu post-surgery, reconsidering flu vaccine. 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 Over due for GYN follow up but follow-up scheduled. Missed breast MRI due to surgery, plans to discuss rescheduling with her GYN. Up to date with dental cleanings; missed eye exam last year plans to schedule - Consider flu vaccine this fall, declines prevnar and hep b. - Attend scheduled Pap smear on September 4th. - Discuss breast MRI with gynecologist. -Continue regular exercise. Rec counting calories for weight loss- goal 1200-1500 calories/day.      Post-surgical hypothyroidism   Now on synthroid which is being managed by her ENT.      Relevant Medications    levothyroxine (SYNTHROID) 75 MCG tablet   Migraines   No recent migaines.  Keeps imitrex  on hand.       Irritable bowel syndrome with constipation   Has intermittent constipation.  Uses linzess  prn.  Insomnia    Difficulty sleeping, initially managed with Xanax. Advised against Xanax due to habit-forming potential/controlled substance. Suggested hydroxyzine  as an alternative. - Prescribe hydroxyzine  as needed for sleep.      Relevant Medications   hydrOXYzine  (VISTARIL ) 25 MG capsule   Hypertriglyceridemia   Relevant Orders   Lipid panel   Hypertension - Primary   BP Readings from Last 3 Encounters:  05/18/24 (!) 120/58  12/31/23 134/73  11/18/23 136/75   BP stable on metoprolol  and amlodipine .      Adhesive capsulitis of left shoulder    Left arm frozen shoulder post-surgery. Discontinued physical therapy, continues independent exercises. - Continue home exercises for shoulder mobility.      Other Visit Diagnoses       Vitamin D  deficiency       Relevant Orders   VITAMIN D  25 Hydroxy (Vit-D Deficiency, Fractures)       I have discontinued Gloria HERO. Viens's ALPRAZolam, oseltamivir , ondansetron , and benzonatate . I am also having her start on hydrOXYzine . Additionally, I am having her maintain her Vitamin D , SUMAtriptan , linaclotide , metoprolol  succinate, amLODipine , and levothyroxine.  Meds ordered this encounter  Medications   hydrOXYzine  (VISTARIL ) 25 MG capsule    Sig: Take 1 capsule (25 mg total) by mouth at bedtime as needed (insomnia).    Dispense:  30 capsule    Refill:  2    Supervising Provider:   DOMENICA BLACKBIRD A [[id number]]

## 2024-05-18 NOTE — Assessment & Plan Note (Addendum)
 BP Readings from Last 3 Encounters:  05/18/24 (!) 120/58  12/31/23 134/73  11/18/23 136/75   BP stable on metoprolol  and amlodipine .

## 2024-05-18 NOTE — Patient Instructions (Signed)
 VISIT SUMMARY:  You had your annual physical exam today. We discussed your thyroid  management, blood pressure, shoulder mobility, sleep issues, and IBS. We also reviewed your recent health screenings and vaccinations.  YOUR PLAN:  POST-THYROIDECTOMY MANAGEMENT: You are on levothyroxine 75 mcg after your thyroid  surgery. Your thyroid  hormone levels were high a month ago, so we reduced your dosage. -Continue taking levothyroxine 75 mcg daily. -Follow up with your ENT specialist in September.  HYPERTENSION: Your blood pressure is well-managed with metoprolol  and amlodipine , though you occasionally have low readings. -Continue taking metoprolol  and amlodipine  as prescribed. -Regularly monitor your blood pressure.  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 -Continue doing home exercises to improve shoulder mobility.  INSOMNIA: You have trouble sleeping and were initially using Xanax, which is not recommended long-term. -Use hydroxyzine  as needed for sleep instead of Xanax.  IRRITABLE BOWEL SYNDROME (IBS): Your IBS symptoms are well-managed at home but worsen during travel. -Continue using Linzess  as needed, especially during travel.  GENERAL HEALTH MAINTENANCE: You are a candidate for pneumonia and hepatitis B vaccines. You had an abnormal Pap smear in July 2023 and missed a breast MRI due to surgery recovery. -Consider getting the flu vaccine. -Attend your scheduled Pap smear on September 4th. -Discuss rescheduling your breast MRI with your gynecologist.  FOLLOW-UP: You have scheduled follow-ups with your ENT specialist and gynecologist. -Follow up with your ENT specialist in September. -Attend your gynecologist appointment on September 4th. -Monitor your blood pressure and weight. -Return for a follow-up visit in six months.

## 2024-05-18 NOTE — Assessment & Plan Note (Signed)
 Advised pt to schedule follow up with GYN.

## 2024-05-18 NOTE — Assessment & Plan Note (Signed)
 No recent migaines.  Keeps imitrex  on hand.

## 2024-05-18 NOTE — Assessment & Plan Note (Signed)
  Left arm frozen shoulder post-surgery. Discontinued physical therapy, continues independent exercises. - Continue home exercises for shoulder mobility.

## 2024-05-18 NOTE — Assessment & Plan Note (Signed)
 Has intermittent constipation.  Uses linzess  prn.

## 2024-05-18 NOTE — Assessment & Plan Note (Signed)
 Now on synthroid which is being managed by her ENT.

## 2024-05-18 NOTE — Assessment & Plan Note (Signed)
  Difficulty sleeping, initially managed with Xanax. Advised against Xanax due to habit-forming potential/controlled substance. Suggested hydroxyzine  as an alternative. - Prescribe hydroxyzine  as needed for sleep.

## 2024-05-19 ENCOUNTER — Ambulatory Visit: Payer: Self-pay | Admitting: Family

## 2024-07-01 DIAGNOSIS — E039 Hypothyroidism, unspecified: Secondary | ICD-10-CM | POA: Diagnosis not present

## 2024-07-06 DIAGNOSIS — E039 Hypothyroidism, unspecified: Secondary | ICD-10-CM | POA: Diagnosis not present

## 2024-07-06 DIAGNOSIS — Z9089 Acquired absence of other organs: Secondary | ICD-10-CM | POA: Diagnosis not present

## 2024-07-06 DIAGNOSIS — Z9889 Other specified postprocedural states: Secondary | ICD-10-CM | POA: Diagnosis not present

## 2024-07-07 DIAGNOSIS — D582 Other hemoglobinopathies: Secondary | ICD-10-CM | POA: Diagnosis not present

## 2024-07-07 DIAGNOSIS — R350 Frequency of micturition: Secondary | ICD-10-CM | POA: Diagnosis not present

## 2024-07-07 DIAGNOSIS — R87610 Atypical squamous cells of undetermined significance on cytologic smear of cervix (ASC-US): Secondary | ICD-10-CM | POA: Diagnosis not present

## 2024-07-07 DIAGNOSIS — R87810 Cervical high risk human papillomavirus (HPV) DNA test positive: Secondary | ICD-10-CM | POA: Diagnosis not present

## 2024-07-07 DIAGNOSIS — Z01419 Encounter for gynecological examination (general) (routine) without abnormal findings: Secondary | ICD-10-CM | POA: Diagnosis not present

## 2024-07-07 DIAGNOSIS — Z13820 Encounter for screening for osteoporosis: Secondary | ICD-10-CM | POA: Diagnosis not present

## 2024-07-07 DIAGNOSIS — Z6828 Body mass index (BMI) 28.0-28.9, adult: Secondary | ICD-10-CM | POA: Diagnosis not present

## 2024-07-08 DIAGNOSIS — D352 Benign neoplasm of pituitary gland: Secondary | ICD-10-CM | POA: Diagnosis not present

## 2024-07-08 DIAGNOSIS — E038 Other specified hypothyroidism: Secondary | ICD-10-CM | POA: Diagnosis not present

## 2024-07-11 DIAGNOSIS — D352 Benign neoplasm of pituitary gland: Secondary | ICD-10-CM | POA: Diagnosis not present

## 2024-07-11 DIAGNOSIS — E038 Other specified hypothyroidism: Secondary | ICD-10-CM | POA: Diagnosis not present

## 2024-08-02 DIAGNOSIS — R140 Abdominal distension (gaseous): Secondary | ICD-10-CM | POA: Diagnosis not present

## 2024-08-10 DIAGNOSIS — R311 Benign essential microscopic hematuria: Secondary | ICD-10-CM | POA: Diagnosis not present

## 2024-09-01 ENCOUNTER — Other Ambulatory Visit: Payer: Self-pay | Admitting: Obstetrics and Gynecology

## 2024-09-01 DIAGNOSIS — Z1231 Encounter for screening mammogram for malignant neoplasm of breast: Secondary | ICD-10-CM

## 2024-09-26 DIAGNOSIS — R319 Hematuria, unspecified: Secondary | ICD-10-CM | POA: Diagnosis not present

## 2024-09-27 ENCOUNTER — Encounter: Payer: Self-pay | Admitting: Obstetrics and Gynecology

## 2024-09-27 ENCOUNTER — Ambulatory Visit
Admission: RE | Admit: 2024-09-27 | Discharge: 2024-09-27 | Disposition: A | Discharge status: Home / Self Care | Source: Ambulatory Visit | Attending: Obstetrics and Gynecology | Admitting: Obstetrics and Gynecology

## 2024-09-27 DIAGNOSIS — Z1231 Encounter for screening mammogram for malignant neoplasm of breast: Secondary | ICD-10-CM

## 2024-10-07 ENCOUNTER — Other Ambulatory Visit: Payer: Self-pay

## 2024-10-07 ENCOUNTER — Other Ambulatory Visit: Payer: Self-pay | Admitting: Family

## 2024-10-07 ENCOUNTER — Other Ambulatory Visit: Payer: Self-pay | Admitting: *Deleted

## 2024-10-07 MED ORDER — AMLODIPINE BESYLATE 10 MG PO TABS: 10.0000 mg | ORAL_TABLET | Freq: Every day | ORAL | 1 refills | Status: AC

## 2024-11-18 ENCOUNTER — Ambulatory Visit: Admitting: Family

## 2024-11-18 ENCOUNTER — Encounter: Payer: Self-pay | Admitting: Family

## 2024-11-18 VITALS — BP 132/64 | HR 71 | Temp 98.0°F | Resp 16 | Ht 62.0 in | Wt 158.0 lb

## 2024-11-18 DIAGNOSIS — R1310 Dysphagia, unspecified: Secondary | ICD-10-CM | POA: Diagnosis not present

## 2024-11-18 DIAGNOSIS — R87619 Unspecified abnormal cytological findings in specimens from cervix uteri: Secondary | ICD-10-CM | POA: Diagnosis not present

## 2024-11-18 DIAGNOSIS — K581 Irritable bowel syndrome with constipation: Secondary | ICD-10-CM

## 2024-11-18 DIAGNOSIS — E890 Postprocedural hypothyroidism: Secondary | ICD-10-CM | POA: Diagnosis not present

## 2024-11-18 DIAGNOSIS — R3129 Other microscopic hematuria: Secondary | ICD-10-CM

## 2024-11-18 DIAGNOSIS — G4700 Insomnia, unspecified: Secondary | ICD-10-CM | POA: Diagnosis not present

## 2024-11-18 DIAGNOSIS — G43909 Migraine, unspecified, not intractable, without status migrainosus: Secondary | ICD-10-CM | POA: Diagnosis not present

## 2024-11-18 DIAGNOSIS — I10 Essential (primary) hypertension: Secondary | ICD-10-CM | POA: Diagnosis not present

## 2024-11-18 DIAGNOSIS — E663 Overweight: Secondary | ICD-10-CM

## 2024-11-18 MED ORDER — OMEPRAZOLE 40 MG PO CPDR: 40.0000 mg | DELAYED_RELEASE_CAPSULE | Freq: Every day | ORAL | 3 refills | Status: AC

## 2024-11-18 NOTE — Assessment & Plan Note (Signed)
 Stable with prn linzess .

## 2024-11-18 NOTE — Progress Notes (Signed)
 "  Subjective:     Patient ID: Gloria Bass, female    DOB: 1966/08/28, 59 y.o.   MRN: [id number]  Chief Complaint  Patient presents with   Hypertension    Here for follow up    HPI  Discussed the use of AI scribe software for clinical note transcription with the patient, who gave verbal consent to proceed.  History of Present Illness Gloria Bass is a 59 year old female with a history of thyroidectomy who presents with recurrent symptoms of throat fullness and shortness of breath.  She experiences shortness of breath when walking up stairs and a choking sensation in her throat, which had resolved after her thyroid  surgery but has now returned. She is currently taking 75 micrograms of levothyroxine.  She has been experiencing sleep disturbances, having stopped Xanax post-surgery and finding hydroxyzine  ineffective. She sleeps 2-3 hours on some nights and up to 5 hours on others, yet feels rested in the morning. She occasionally uses Tylenol  PM, which helps her sleep but leaves her feeling sleepy the next day.  She reports persistent microscopic hematuria despite antibiotic treatment for a presumed urinary tract infection, and has not yet been seen by a urologist due to a missed referral.  She is concerned about weight gain, having gained over ten pounds above her goal weight despite exercising most days and eating one meal a day.  Her blood pressure is generally well-controlled with home readings mostly within range, and she is compliant with her medications, including amlodipine  10 mg and Toprol  XL 50 mg. She has not experienced any recent migraines and still has a prescription for them, though she has not used it in years.      Health Maintenance Due  Topic Date Due   Pneumococcal Vaccine: 50+ Years (1 of 2 - PCV) Never done   COVID-19 Vaccine (5 - 2025-26 season) 07/04/2024    Past Medical History:  Diagnosis Date   Abnormal cervical Pap smear with positive HPV DNA  test 09/20/2014   Allergic rhinitis    Anxiety 09/20/2014   Cervical stenosis (uterine cervix)    Chest pain 11/09/2017   Colon cancer screening 04/22/2017   Displaced fracture of distal phalanx of lesser toe of left foot 09/20/2014   DYSPNEA 05/12/2008   Qualifier: Diagnosis of  By: Darlean MD, Ozell B    Elevated alkaline phosphatase level 11/21/2020   Endometrial polyp 06/03/2018   Essential hypertension 03/23/2017   History of pituitary tumor    prolactinoma   Hypertension    Hyperthyroidism 05/16/2008   Formatting of this note might be different from the original. Overview:  Qualifier: Diagnosis of  By: Winona Raring   Irritable bowel syndrome with constipation 09/20/2014   Localized edema 09/15/2017   Mediastinal mass 01/15/2021   Migraines    Mild intermittent asthma with acute exacerbation 12/30/2016   Multinodular goiter 11/20/2020   Other headache syndrome 09/07/2014   Overweight 03/23/2017   Pituitary adenoma (HCC) 09/07/2014   PMB (postmenopausal bleeding)    Preventative health care 03/13/2021   Serotonin syndrome 03/23/2017   Subclinical hyperthyroidism 11/20/2020   Subclinical hypothyroidism 05/16/2008   Qualifier: Diagnosis of  By: Winona Raring     Symptomatic menopausal or female climacteric states 09/20/2014   Thyrotoxicosis 05/16/2008   Formatting of this note might be different from the original. Overview:  Qualifier: Diagnosis of  By: Winona Raring    Past Surgical History:  Procedure Laterality Date   COLONOSCOPY WITH PROPOFOL   2018   DOBUTAMINE STRESS ECHO  11-30-2017   dr edwyna   normal withou evidence ischemia/  normal LVEF   HYSTEROSCOPY WITH D & C N/A 06/03/2018   Procedure: DILATATION AND CURETTAGE /HYSTEROSCOPY;  Surgeon: Leva Rush, MD;  Location: [facility] [place];  Service: Gynecology;  Laterality: N/A;   THYROIDECTOMY  09/17/2023   TRANSPHENOIDAL PITUITARY RESECTION  age 55  @ [facility]   benign    Family History  Problem  Relation Age of Onset   Hypertension Mother    Hypertension Father     Social History   Socioeconomic History   Marital status: Legally Separated    Spouse name: Not on file   Number of children: 1   Years of education: Not on file   Highest education level: Not on file  Occupational History   Occupation: Emergency planning/management officer  Tobacco Use   Smoking status: Never   Smokeless tobacco: Never  Vaping Use   Vaping status: Never Used  Substance and Sexual Activity   Alcohol use: Yes    Comment: occasional   Drug use: No   Sexual activity: Not Currently    Birth control/protection: Post-menopausal  Other Topics Concern   Not on file  Social History Narrative   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   Single   Has son (adult)-  Lives in [place]   [employer]   Enjoys making jewelry   Spending time with her niece/nephews   No pets   Social Drivers of Health   Tobacco Use: Low Risk (07/06/2024)   Received from [facility]   Patient History    Smoking Tobacco Use: Never    Smokeless Tobacco Use: Never    Passive Exposure: Never  Financial Resource Strain: Not on file  Food Insecurity: Low Risk (04/30/2023)   Received from [facility]   Epic    Within the past 12 months, you worried that your food would run out before you got money to buy more: Never true    Within the past 12 months, the food you bought just didn't last and you didn't have money to get more. : Never true  Transportation Needs: No Transportation Needs (04/30/2023)   Received from [facility]    In the past 12 months, has lack of reliable transportation kept you from medical appointments, meetings, work or from getting things needed for daily living? : No  Physical Activity: Not on file  Stress: Not on file  Social Connections: Not on file  Intimate Partner Violence: Not on file  Depression (PHQ2-9): Low Risk (11/18/2024)   Depression (PHQ2-9)    PHQ-2 Score: 1  Alcohol  Screen: Not on file  Housing: Low Risk (04/30/2023)   Received from [facility]   Epic    What is your living situation today?: I have a steady place to live    Think about the place you live. Do you have problems with any of the following? Choose all that apply:: None/None on this list  Utilities: Low Risk (04/30/2023)   Received from [facility]   Utilities    In the past 12 months has the electric, gas, oil, or water company threatened to shut off services in your home? : No  Health Literacy: Not on file    Outpatient Medications Prior to Visit  Medication Sig Dispense Refill   amLODipine  (NORVASC ) 10 MG tablet Take 1 tablet (10 mg total) by mouth daily. 90 tablet  1   Cholecalciferol (VITAMIN D ) 50 MCG (2000 UT) tablet Take 2,000 Units by mouth daily.     levothyroxine (SYNTHROID) 75 MCG tablet Take 75 mcg by mouth daily.     linaclotide  (LINZESS ) 290 MCG CAPS capsule Take 1 capsule (290 mcg total) by mouth daily before breakfast. 30 capsule 2   metoprolol  succinate (TOPROL -XL) 50 MG 24 hr tablet TAKE 1 TABLET(50 MG) BY MOUTH DAILY WITH OR IMMEDIATELY FOLLOWING A MEAL 90 tablet 1   SUMAtriptan  (IMITREX ) 100 MG tablet Take 0.5-1 tablets (50-100 mg total) by mouth every 2 (two) hours as needed for migraine. 10 tablet 5   hydrOXYzine  (VISTARIL ) 25 MG capsule Take 1 capsule (25 mg total) by mouth at bedtime as needed (insomnia). 30 capsule 2   No facility-administered medications prior to visit.    Allergies[1]  ROS See HPI    Objective:    Physical Exam Constitutional:      General: She is not in acute distress.    Appearance: Normal appearance. She is well-developed.  HENT:     Head: Normocephalic and atraumatic.     Right Ear: External ear normal.     Left Ear: External ear normal.  Eyes:     General: No scleral icterus. Neck:     Comments: + fullness right lower anterior neck Cardiovascular:     Rate and Rhythm: Normal rate and regular rhythm.     Heart sounds:  Normal heart sounds. No murmur heard. Pulmonary:     Effort: Pulmonary effort is normal. No respiratory distress.     Breath sounds: Normal breath sounds. No wheezing.  Musculoskeletal:     Cervical back: Neck supple.  Skin:    General: Skin is warm and dry.  Neurological:     Mental Status: She is alert and oriented to person, place, and time.  Psychiatric:        Mood and Affect: Mood normal.        Behavior: Behavior normal.        Thought Content: Thought content normal.        Judgment: Judgment normal.      BP 132/64 (BP Location: Right Arm)   Pulse 71   Temp 98 F (36.7 C) (Oral)   Resp 16   Ht 5' 2 (1.575 m)   Wt 158 lb (71.7 kg)   SpO2 100%   BMI 28.90 kg/m  Wt Readings from Last 3 Encounters:  11/18/24 158 lb (71.7 kg)  05/18/24 152 lb (68.9 kg)  12/31/23 145 lb (65.8 kg)       Assessment & Plan:   Problem List Items Addressed This Visit       Unprioritized   Post-surgical hypothyroidism    Symptoms suggest suboptimal levothyroxine dosing. Possible residual thyroid  tissue or scarring. - Continue levothyroxine 75 mcg daily. - Ordered thyroid  ultrasound to assess for residual tissue or scarring. - Follow up with endocrinologist Dr. Tobie in February for medication adjustment.      Overweight    Weight gain despite exercise and dietary modifications. Possible contribution from thyroid  issues and menopause. - Encouraged continued exercise and dietary modifications.      Migraines    No recent migraines reported. Sumatriptan  prescription maintained. - Continue to maintain sumatriptan  prescription for migraine management.      Microscopic hematuria    Persistent despite antibiotic treatment. Referral to urology was not completed. - Placed referral to urology for further evaluation.      Relevant Orders  Ambulatory referral to Urology   Irritable bowel syndrome with constipation   Stable with prn linzess .       Relevant Medications    omeprazole  (PRILOSEC) 40 MG capsule   Insomnia    Difficulty sleeping. Prefers not to use daily medication. Tylenol  PM causes drowsiness. - Discussed potential use of trazodone if sleep issues worsen.       Hypertension    Blood pressure well-controlled with current regimen. - Continue current antihypertensive regimen with amlodipine  10 mg and metoprolol  succinate 50 mg daily.      Dysphagia    Intermittent dysphagia and throat fullness. Possible residual thyroid  tissue, esophageal narrowing, or reflux. - Ordered thyroid  ultrasound to rule out residual thyroid  tissue. - Prescribed antacid medication for one month to assess for reflux-related symptoms. - Consider GI referral for endoscopy if symptoms persist and ultrasound is normal.      Relevant Medications   omeprazole  (PRILOSEC) 40 MG capsule   Other Relevant Orders   US  THYROID    Abnormal Pap smear of cervix - Primary    Abnormal Pap smear with positive HPV DNA test. Follow-up with gynecology ongoing. - Continue follow-up with gynecology for Pap smear management.       Assessment & Plan    I have discontinued Gloria HERO. Gillie's hydrOXYzine . I am also having her start on omeprazole . Additionally, I am having her maintain her Vitamin D , SUMAtriptan , linaclotide , levothyroxine, metoprolol  succinate, and amLODipine .  Meds ordered this encounter  Medications   omeprazole  (PRILOSEC) 40 MG capsule    Sig: Take 1 capsule (40 mg total) by mouth daily.    Dispense:  30 capsule    Refill:  3    Supervising Provider:   DOMENICA BLACKBIRD A [[id number]]      [1]  Allergies Allergen Reactions   Lisinopril Swelling    Angioedema    "

## 2024-11-18 NOTE — Assessment & Plan Note (Signed)
 Following closely with GYN

## 2024-11-18 NOTE — Progress Notes (Unsigned)
" ° °  Established Patient Office Visit  Subjective   Patient ID: Gloria Bass, female    DOB: [birth year]  Age: 59 y.o. MRN: [id number]  Chief Complaint  Patient presents with   Hypertension    Here for follow up   HPI Patient in office today for follow-up visit. Patient reports good blood pressure management with current blood pressure medication regime. Patient has detailed log of blood pressure readings with >90% within goal. Patient was started on hydroxine to aid in sleep. However, patient reports that it did not help and that she has discontinued use. Patient reports weight gain despite eating healthy diet, exercising, and using wave vibration that was recommended by one of her doctors. Reports recovering well since thyroidectomy, but feels like similar symptoms prior to her surgery have returned like feeling like she is choking when eating certain foods as well as feeling short of breath when using stairs in her house.   Review of Systems  Constitutional: Negative.   HENT: Negative.    Eyes: Negative.   Respiratory: Negative.    Cardiovascular: Negative.   Gastrointestinal: Negative.   Genitourinary:  Positive for hematuria.       Reports microscopic hematuria at last 4 visits with gynecology was suppose to receive referral to urology, but they never called    Skin: Negative.   Neurological: Negative.   Endo/Heme/Allergies: Negative.   Psychiatric/Behavioral: Negative.       Objective:     BP 132/64 (BP Location: Right Arm)   Pulse 71   Temp 98 F (36.7 C) (Oral)   Resp 16   Ht 5' 2 (1.575 m)   Wt 158 lb (71.7 kg)   SpO2 100%   BMI 28.90 kg/m  BP Readings from Last 3 Encounters:  11/18/24 132/64  05/18/24 (!) 120/58  12/31/23 134/73   Physical Exam Vitals reviewed.  Constitutional:      Appearance: Normal appearance.  HENT:     Head: Normocephalic.  Neck:     Comments: Right side of mid throat notably swollen visually and palpable  Cardiovascular:      Rate and Rhythm: Normal rate and regular rhythm.     Heart sounds: Normal heart sounds.  Pulmonary:     Effort: Pulmonary effort is normal.     Breath sounds: Normal breath sounds.  Musculoskeletal:     Cervical back: Normal range of motion.  Neurological:     Mental Status: She is alert and oriented to person, place, and time.  Psychiatric:        Mood and Affect: Mood normal.        Behavior: Behavior normal.    Last thyroid  functions Lab Results  Component Value Date   TSH 0.20 (L) 12/05/2022   T3TOTAL 117 11/20/2020   FREET4 0.70 12/05/2022    The 10-year ASCVD risk score (Arnett DK, et al., 2019) is: 7.4%    Assessment & Plan:   Dysphagia -Thyroid  U/S -omeprazole  prescription -Endocrinologist appointment 12/12/2024  Microscopic hematuria -Urology referral   Abnormal pap -follow up with gynecologist   Hypertension -continue medication regime    Return in 4 weeks or sooner if symptoms worsen.    Levon Budd, FNP student   "

## 2024-11-21 DIAGNOSIS — R1310 Dysphagia, unspecified: Secondary | ICD-10-CM | POA: Insufficient documentation

## 2024-11-21 DIAGNOSIS — R3129 Other microscopic hematuria: Secondary | ICD-10-CM | POA: Insufficient documentation

## 2024-11-21 NOTE — Assessment & Plan Note (Signed)
" °  No recent migraines reported. Sumatriptan  prescription maintained. - Continue to maintain sumatriptan  prescription for migraine management. "

## 2024-11-21 NOTE — Patient Instructions (Signed)
" °  VISIT SUMMARY: During your visit, we discussed your symptoms of throat fullness and shortness of breath, sleep disturbances, persistent microscopic hematuria, weight gain, and other health concerns. We reviewed your current medications and made adjustments where necessary. We also planned follow-up appointments and additional tests to further evaluate your conditions.  YOUR PLAN: -POST-THYROIDECTOMY HYPOTHYROIDISM: Your symptoms suggest that your current dose of levothyroxine may not be optimal. We will continue your current dose of 75 mcg daily and have ordered a thyroid  ultrasound to check for any remaining thyroid  tissue or scarring. You will follow up with your endocrinologist, Dr. Tobie, in February for any necessary medication adjustments.  -DYSPHAGIA AND THROAT FULLNESS: You are experiencing intermittent difficulty swallowing and a sensation of fullness in your throat. This could be due to residual thyroid  tissue, esophageal narrowing, or reflux. We have ordered a thyroid  ultrasound and prescribed an antacid medication for one month to see if it helps. If your symptoms persist and the ultrasound is normal, we may refer you to a gastroenterologist for further evaluation.  -MICROSCOPIC HEMATURIA: You have persistent microscopic blood in your urine despite antibiotic treatment. We have placed a referral to urology for further evaluation.  -HYPERTENSION: Your blood pressure is well-controlled with your current medications. Please continue taking amlodipine  10 mg and metoprolol  succinate 50 mg daily as prescribed.  -INSOMNIA: You are having difficulty sleeping and prefer not to use daily medication. We discussed the potential use of trazodone if your sleep issues worsen.  -IRRITABLE BOWEL SYNDROME WITH CONSTIPATION: You have irritable bowel syndrome with constipation and use Linzess  as needed, primarily when traveling. Please continue using Linzess  as needed for constipation management.  -OBESITY  AND WEIGHT MANAGEMENT: You have gained weight despite exercising and eating one meal a day. This could be related to thyroid  issues and menopause. Please continue your exercise routine and dietary modifications.  -ABNORMAL CERVICAL PAPANICOLAOU SMEAR: You had an abnormal Pap smear with a positive HPV DNA test. You should continue your follow-up with gynecology for Pap smear management.  -MIGRAINES: You have not had any recent migraines, but you still have a prescription for sumatriptan . Please continue to maintain your sumatriptan  prescription for migraine management.  INSTRUCTIONS: Please follow up with your endocrinologist, Dr. Tobie, in February for medication adjustment. We have also placed a referral to urology for further evaluation of your microscopic hematuria. If your throat fullness and dysphagia symptoms persist after the ultrasound and antacid treatment, we may refer you to a gastroenterologist for further evaluation.    Contains text generated by [employer].   "

## 2024-11-21 NOTE — Assessment & Plan Note (Signed)
" °  Blood pressure well-controlled with current regimen. - Continue current antihypertensive regimen with amlodipine  10 mg and metoprolol  succinate 50 mg daily. "

## 2024-11-21 NOTE — Assessment & Plan Note (Signed)
" °  Persistent despite antibiotic treatment. Referral to urology was not completed. - Placed referral to urology for further evaluation. "

## 2024-11-21 NOTE — Assessment & Plan Note (Signed)
" °  Weight gain despite exercise and dietary modifications. Possible contribution from thyroid  issues and menopause. - Encouraged continued exercise and dietary modifications. "

## 2024-11-21 NOTE — Assessment & Plan Note (Signed)
" °  Symptoms suggest suboptimal levothyroxine dosing. Possible residual thyroid  tissue or scarring. - Continue levothyroxine 75 mcg daily. - Ordered thyroid  ultrasound to assess for residual tissue or scarring. - Follow up with endocrinologist Dr. Tobie in February for medication adjustment. "

## 2024-11-21 NOTE — Assessment & Plan Note (Signed)
" °  Difficulty sleeping. Prefers not to use daily medication. Tylenol  PM causes drowsiness. - Discussed potential use of trazodone if sleep issues worsen.  "

## 2024-11-21 NOTE — Assessment & Plan Note (Signed)
" °  Intermittent dysphagia and throat fullness. Possible residual thyroid  tissue, esophageal narrowing, or reflux. - Ordered thyroid  ultrasound to rule out residual thyroid  tissue. - Prescribed antacid medication for one month to assess for reflux-related symptoms. - Consider GI referral for endoscopy if symptoms persist and ultrasound is normal. "

## 2024-11-25 ENCOUNTER — Ambulatory Visit (HOSPITAL_BASED_OUTPATIENT_CLINIC_OR_DEPARTMENT_OTHER)
Admission: RE | Admit: 2024-11-25 | Discharge: 2024-11-25 | Disposition: A | Source: Ambulatory Visit | Attending: Family | Admitting: Family

## 2024-11-25 DIAGNOSIS — R1310 Dysphagia, unspecified: Secondary | ICD-10-CM | POA: Insufficient documentation

## 2024-11-29 ENCOUNTER — Other Ambulatory Visit

## 2024-11-29 ENCOUNTER — Ambulatory Visit: Payer: Self-pay | Admitting: Family

## 2024-11-29 DIAGNOSIS — R221 Localized swelling, mass and lump, neck: Secondary | ICD-10-CM

## 2024-11-29 NOTE — Telephone Encounter (Signed)
 Please advise patient that her ultrasound shows a 0.9 cm nodule on the left side of her neck.  I would like for her to complete some additional blood work to check her calcium levels and parathyroid levels as the radiologist thought that this may be in her parathyroid gland and that can cause issues with calcium.

## 2024-11-30 ENCOUNTER — Other Ambulatory Visit (INDEPENDENT_AMBULATORY_CARE_PROVIDER_SITE_OTHER)

## 2024-11-30 DIAGNOSIS — R221 Localized swelling, mass and lump, neck: Secondary | ICD-10-CM | POA: Diagnosis not present

## 2024-12-01 LAB — PTH, INTACT AND CALCIUM
Calcium: 9.5 mg/dL (ref 8.6–10.4)
PTH: 54 pg/mL (ref 16–77)

## 2024-12-05 ENCOUNTER — Ambulatory Visit: Payer: Self-pay | Admitting: Family
